# Patient Record
Sex: Female | Born: 1995
Health system: Southern US, Community
[De-identification: ages and names within clinical notes are randomized; demographics above are authoritative.]

## PROBLEM LIST (undated history)

## (undated) ENCOUNTER — Inpatient Hospital Stay: Payer: Self-pay

## (undated) DIAGNOSIS — T783XXA Angioneurotic edema, initial encounter: Secondary | ICD-10-CM

## (undated) DIAGNOSIS — F329 Major depressive disorder, single episode, unspecified: Secondary | ICD-10-CM

## (undated) DIAGNOSIS — F419 Anxiety disorder, unspecified: Secondary | ICD-10-CM

## (undated) DIAGNOSIS — J45909 Unspecified asthma, uncomplicated: Secondary | ICD-10-CM

## (undated) DIAGNOSIS — B2790 Infectious mononucleosis, unspecified without complication: Secondary | ICD-10-CM

## (undated) HISTORY — PX: WISDOM TOOTH EXTRACTION: SHX21

## (undated) HISTORY — DX: Unspecified asthma, uncomplicated: J45.909

## (undated) HISTORY — DX: Angioneurotic edema, initial encounter: T78.3XXA

---
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
## 2017-01-27 ENCOUNTER — Emergency Department
Admission: EM | Admit: 2017-01-27 | Discharge: 2017-01-27 | Disposition: A | Discharge status: Home / Self Care | Payer: 59 | Attending: Emergency Medicine | Admitting: Emergency Medicine

## 2017-01-27 ENCOUNTER — Encounter: Payer: Self-pay | Admitting: Emergency Medicine

## 2017-01-27 DIAGNOSIS — R000 Tachycardia, unspecified: Secondary | ICD-10-CM | POA: Diagnosis present

## 2017-01-27 LAB — BASIC METABOLIC PANEL
Anion gap: 8 (ref 5–15)
BUN: 11 mg/dL (ref 6–20)
CALCIUM: 9.4 mg/dL (ref 8.9–10.3)
CO2: 24 mmol/L (ref 22–32)
CREATININE: 0.65 mg/dL (ref 0.44–1.00)
Chloride: 107 mmol/L (ref 101–111)
GFR calc Af Amer: >60 mL/min (ref >60)
GLUCOSE: 95 mg/dL (ref 65–99)
Potassium: 3.4 mmol/L — ABNORMAL LOW (ref 3.5–5.1)
Sodium: 139 mmol/L (ref 135–145)

## 2017-01-27 LAB — URINALYSIS, COMPLETE (UACMP) WITH MICROSCOPIC
Bilirubin Urine: NEGATIVE
GLUCOSE, UA: NEGATIVE mg/dL
Hgb urine dipstick: NEGATIVE
Ketones, ur: 20 mg/dL — AB
Leukocytes, UA: NEGATIVE
NITRITE: NEGATIVE
PROTEIN: NEGATIVE mg/dL
SPECIFIC GRAVITY, URINE: 1.013 (ref 1.005–1.030)
pH: 6 (ref 5.0–8.0)

## 2017-01-27 LAB — CBC
HCT: 38.9 % (ref 35.0–47.0)
HEMOGLOBIN: 13.4 g/dL (ref 12.0–16.0)
MCH: 31.2 pg (ref 26.0–34.0)
MCHC: 34.4 g/dL (ref 32.0–36.0)
MCV: 90.9 fL (ref 80.0–100.0)
PLATELETS: 267 10*3/uL (ref 150–440)
RBC: 4.28 MIL/uL (ref 3.80–5.20)
RDW: 13.8 % (ref 11.5–14.5)
WBC: 11.9 10*3/uL — ABNORMAL HIGH (ref 3.6–11.0)

## 2017-01-27 LAB — TSH: TSH: 2.169 u[IU]/mL (ref 0.350–4.500)

## 2017-01-27 LAB — TROPONIN I: Troponin I: <0.03 ng/mL (ref <0.03)

## 2017-01-27 LAB — POCT PREGNANCY, URINE: Preg Test, Ur: NEGATIVE

## 2017-01-27 NOTE — ED Triage Notes (Signed)
Patient with complaint of feeling dizzy that started yesterday. Patient states that yesterday when she started feeling dizzy that her heart rate was 150. Patient states that since then she continues to feel dizzy and that her heart rate has been running between 120 and 130.

## 2017-01-27 NOTE — ED Provider Notes (Signed)
[facility] Emergency Department Provider Note   ____________________________________________   I have reviewed the triage vital signs and the nursing notes.   HISTORY  Chief Complaint Tachycardia and Dizziness   History limited by: Not Limited   HPI Ariel Ortega is a 21 y.o. female who presents to the emergency department today because of concerns for fast heart rate. The patient states she first noticed it yesterday. She states that she was at work when it started. She started feeling like her heart was racing and she felt dizzy. She checked her heart rate at that time it was elevated to 130 to 150. She states that it was again elevated today. At the time my examination however she states she feels better. She does not have dizziness. She denies any pain. She denies similar symptoms in the past. She denies any heat intolerance. Denies any unintended weight loss.   Past Medical History:  Diagnosis Date  . Anxiety   . Depression 04/19/2014  . Mononucleosis 04/09/11    Patient Active Problem List   Diagnosis Date Noted  . Benign paroxysmal positional vertigo 09/23/2014  . History of drug overdose 05/08/2014  . Panic disorder 04/22/2014  . MDD (major depressive disorder), recurrent episode, severe (HCC) 04/19/2014  . GAD (generalized anxiety disorder) 12/25/2013  . Panic attacks 12/25/2013  . Food allergy 10/02/2012  . Patellar tendonitis 06/14/2012    History reviewed. No pertinent surgical history.  Prior to Admission medications   Medication Sig Start Date End Date Taking? Authorizing Provider  EPINEPHrine (EPIPEN) 0.3 mg/0.3 mL DEVI Inject 0.3 mLs (0.3 mg total) into the muscle once. 10/02/12   Joaquim Nam, MD    Allergies Effexor [venlafaxine] and Milk-related compounds  Family History  Problem Relation Age of Onset  . Cancer - Prostate Father   . Hyperlipidemia Father   . Hypertension Father   . Depression Mother   . Anxiety  disorder Mother     Social History Social History  Substance Use Topics  . Smoking status: Never Smoker  . Smokeless tobacco: Never Used  . Alcohol use No    Review of Systems Constitutional: No fever/chills Eyes: No visual changes. ENT: No sore throat. Cardiovascular: Positive for fast heart rate.  Respiratory: Denies shortness of breath. Gastrointestinal: No abdominal pain.  No nausea, no vomiting.  No diarrhea.   Genitourinary: Negative for dysuria. Musculoskeletal: Negative for back pain. Skin: Negative for rash. Neurological: Negative for headaches, focal weakness or numbness.  ____________________________________________   PHYSICAL EXAM:  VITAL SIGNS: ED Triage Vitals  Enc Vitals Group     BP 01/27/17 2009 (!) 117/55     Pulse Rate 01/27/17 2009 89     Resp 01/27/17 2009 18     Temp 01/27/17 2009 99 F (37.2 C)     Temp Source 01/27/17 2009 Oral     SpO2 01/27/17 2009 100 %     Weight 01/27/17 2006 139 lb (63 kg)     Height 01/27/17 2006  (1.575 m)   Constitutional: Alert and oriented. Well appearing and in no distress. Eyes: Conjunctivae are normal.  ENT   Head: Normocephalic and atraumatic.   Nose: No congestion/rhinnorhea.   Mouth/Throat: Mucous membranes are moist.   Neck: No stridor. Hematological/Lymphatic/Immunilogical: No cervical lymphadenopathy. Cardiovascular: Normal rate, regular rhythm.  No murmurs, rubs, or gallops.  Respiratory: Normal respiratory effort without tachypnea nor retractions. Breath sounds are clear and equal bilaterally. No wheezes/rales/rhonchi. Gastrointestinal: Soft and non tender. No rebound.  No guarding.  Genitourinary: Deferred Musculoskeletal: Normal range of motion in all extremities. No lower extremity edema. Neurologic:  Normal speech and language. No gross focal neurologic deficits are appreciated.  Skin:  Skin is warm, dry and intact. No rash noted. Psychiatric: Mood and affect are normal. Speech  and behavior are normal. Patient exhibits appropriate insight and judgment.  ____________________________________________    LABS (pertinent positives/negatives)  Labs Reviewed  BASIC METABOLIC PANEL - Abnormal; Notable for the following:       Result Value   Potassium 3.4 (*)    All other components within normal limits  CBC - Abnormal; Notable for the following:    WBC 11.9 (*)    All other components within normal limits  URINALYSIS, COMPLETE (UACMP) WITH MICROSCOPIC - Abnormal; Notable for the following:    Color, Urine YELLOW (*)    APPearance CLEAR (*)    Ketones, ur 20 (*)    Bacteria, UA RARE (*)    Squamous Epithelial / LPF 6-30 (*)    All other components within normal limits  TROPONIN I  TSH  POC URINE PREG, ED  POCT PREGNANCY, URINE     ____________________________________________   EKG  I, Phineas SemenGraydon Lynnea Vandervoort, attending physician, personally viewed and interpreted this EKG  EKG Time: 2009 Rate: 83 Rhythm: sinus rhythm Axis: normal Intervals: qtc 437 QRS: narrow ST changes: no st elevation Impression: normal ekg   ____________________________________________    RADIOLOGY  None  ____________________________________________   PROCEDURES  Procedures  ____________________________________________   INITIAL IMPRESSION / ASSESSMENT AND PLAN / ED COURSE  Pertinent labs & imaging results that were available during my care of the patient were reviewed by me and considered in my medical decision making (see chart for details).  patient presented to the emergency department today because of concerns for tachycardia. Patient was not tachycardic here in the emergency department and did state that she felt better than she had earlier. Workup without any concerning findings. There were some ketones in her urine of unclear significance. Patient states she has been hydrating. Did discuss with patient importance of primary care follow-up. Discussed return  precautions.  ____________________________________________   FINAL CLINICAL IMPRESSION(S) / ED DIAGNOSES  Final diagnoses:  Tachycardia     Note: This dictation was prepared with Dragon dictation. Any transcriptional errors that result from this process are unintentional     Phineas SemenGoodman, Solene Hereford, MD 01/27/17 2205

## 2017-01-27 NOTE — Discharge Instructions (Signed)
Please seek medical attention for any high fevers, chest pain, shortness of breath, change in behavior, persistent vomiting, bloody stool or any other new or concerning symptoms.  

## 2017-01-27 NOTE — ED Notes (Signed)
Pt verbalizes understanding of discharge instructions.

## 2017-05-17 NOTE — L&D Delivery Note (Addendum)
Delivery Note  1431 In room to see patient, reports increased pelvic pressure. SVE: 10/100/+2, vtx. Effective maternal pushing efforts noted.   Spontaneous vaginal birth of liveborn female patient, at 39 weeks 3 days, in right occiput anterior position followed by posterior hand at 1459. Infant immediately to maternal abdomen. Delayed cord clamping, skin to skin, three (3) vessel cord. APGARs: 8, 9. Weight pending. Receiving nurse present at bedside for birth.   Pitocin infusing. Intact placenta delivered at 1506. Second degree perineal and right labial lacerations repaired with 3-0 vicryl rapide under epidural anesthesia. Lacerations well approximated and hemostatic. Uterus firm. Lochia small. QBL: 325 ml. Vault check completed. Counts correct x 2.   Initiate routine postpartum care and orders. Mom to postpartum.  Baby to Couplet care / Skin to Skin.  FOB and patient's mother present at bedside and overjoyed with the birth of EMMALYNE.    Gunnar BullaJenkins Yeny Hans Rusher, CNM [facility], [facility] 02/04/2018, 3:36 PM

## 2017-06-09 ENCOUNTER — Encounter: Payer: Self-pay | Admitting: Obstetrics and Gynecology

## 2017-06-09 ENCOUNTER — Ambulatory Visit: Payer: Self-pay | Admitting: Obstetrics and Gynecology

## 2017-06-09 VITALS — BP 115/69 | HR 92 | Ht 62.0 in | Wt 145.3 lb

## 2017-06-09 DIAGNOSIS — N912 Amenorrhea, unspecified: Secondary | ICD-10-CM

## 2017-06-09 DIAGNOSIS — Z3401 Encounter for supervision of normal first pregnancy, first trimester: Secondary | ICD-10-CM

## 2017-06-09 LAB — POCT URINE PREGNANCY: Preg Test, Ur: POSITIVE — AB

## 2017-06-09 NOTE — Progress Notes (Signed)
HPI:      [name] is a 22 y.o. G1P0000 who LMP was Patient's last menstrual period was 01/13/2017 (lmp unknown).  Subjective:   She presents today complaining of amenorrhea and a positive home pregnancy test.  She is unsure of her last menstrual period but believes that occurred in early December.  She has had some pelvic cramping but denies any bleeding.  She has some nausea but is not vomiting.    Hx: The following portions of the patient's history were reviewed and updated as appropriate:             She  has a past medical history of Anxiety, Depression (04/19/2014), and Mononucleosis (04/09/11). She does not have any pertinent problems on file. She  has no past surgical history on file. Her family history includes Anxiety disorder in her mother; Cancer in her father; Cancer - Prostate in her father; Depression in her mother; Hyperlipidemia in her father; Hypertension in her father. She  reports that  has never smoked. she has never used smokeless tobacco. She reports that she does not drink alcohol or use drugs. She is allergic to effexor [venlafaxine] and milk-related compounds.       Review of Systems:  Review of Systems  Constitutional: Denied constitutional symptoms, night sweats, recent illness, fatigue, fever, insomnia and weight loss.  Eyes: Denied eye symptoms, eye pain, photophobia, vision change and visual disturbance.  Ears/Nose/Throat/Neck: Denied ear, nose, throat or neck symptoms, hearing loss, nasal discharge, sinus congestion and sore throat.  Cardiovascular: Denied cardiovascular symptoms, arrhythmia, chest pain/pressure, edema, exercise intolerance, orthopnea and palpitations.  Respiratory: Denied pulmonary symptoms, asthma, pleuritic pain, productive sputum, cough, dyspnea and wheezing.  Gastrointestinal: Denied, gastro-esophageal reflux, melena, nausea and vomiting.  Genitourinary: Denied genitourinary symptoms including symptomatic vaginal discharge,  pelvic relaxation issues, and urinary complaints.  Musculoskeletal: Denied musculoskeletal symptoms, stiffness, swelling, muscle weakness and myalgia.  Dermatologic: Denied dermatology symptoms, rash and scar.  Neurologic: Denied neurology symptoms, dizziness, headache, neck pain and syncope.  Psychiatric: Denied psychiatric symptoms, anxiety and depression.  Endocrine: Denied endocrine symptoms including hot flashes and night sweats.   Meds:   Current Outpatient Medications on File Prior to Visit  Medication Sig Dispense Refill  . EPINEPHrine (EPIPEN) 0.3 mg/0.3 mL DEVI Inject 0.3 mLs (0.3 mg total) into the muscle once. 2 Device 1  . Prenatal Vit-Fe Fumarate-FA (PRENATAL MULTIVITAMIN) TABS tablet Take 1 tablet by mouth daily at 12 noon.     No current facility-administered medications on file prior to visit.     Objective:     Vitals:   06/09/17 1608  BP: 115/69  Pulse: 92    Urinary beta-hCG positive            Assessment:    G1P0000 Patient Active Problem List   Diagnosis Date Noted  . Benign paroxysmal positional vertigo 09/23/2014  . History of drug overdose 05/08/2014  . Panic disorder 04/22/2014  . MDD (major depressive disorder), recurrent episode, severe (HCC) 04/19/2014  . GAD (generalized anxiety disorder) 12/25/2013  . Panic attacks 12/25/2013  . Food allergy 10/02/2012  . Patellar tendonitis 06/14/2012     1. Amenorrhea   2. Supervision of normal first teen pregnancy in first trimester     Uncomplicated teen first trimester pregnancy-unknown dates.   Plan:            Prenatal Plan 1.  The patient was given prenatal literature. 2.  She was continued on prenatal vitamins. 3.  A  prenatal lab panel was ordered or drawn. 4.  An ultrasound was ordered to better determine an EDC. 5.  A nurse visit was scheduled. 6.  Genetic testing and testing for other inheritable conditions discussed in detail. She will decide in the future whether to have these labs  performed. 7.  A general overview of pregnancy testing, visit schedule, ultrasound schedule, and prenatal care was discussed.   Orders Orders Placed This Encounter  Procedures  . POCT urine pregnancy    No orders of the defined types were placed in this encounter.     F/U  Return in about 5 weeks (around 07/14/2017).  Elonda Huskyavid J. Nikoloz Huy, M.D. 06/09/2017 5:09 PM

## 2017-06-10 ENCOUNTER — Other Ambulatory Visit: Payer: Self-pay | Admitting: Obstetrics and Gynecology

## 2017-06-10 DIAGNOSIS — N926 Irregular menstruation, unspecified: Secondary | ICD-10-CM

## 2017-06-10 DIAGNOSIS — Z3201 Encounter for pregnancy test, result positive: Secondary | ICD-10-CM

## 2017-06-16 ENCOUNTER — Ambulatory Visit (INDEPENDENT_AMBULATORY_CARE_PROVIDER_SITE_OTHER): Payer: Managed Care, Other (non HMO)

## 2017-06-16 DIAGNOSIS — Z3201 Encounter for pregnancy test, result positive: Secondary | ICD-10-CM | POA: Diagnosis not present

## 2017-06-16 DIAGNOSIS — N926 Irregular menstruation, unspecified: Secondary | ICD-10-CM | POA: Diagnosis not present

## 2017-06-27 ENCOUNTER — Encounter: Payer: Self-pay | Admitting: Certified Nurse Midwife

## 2017-06-27 ENCOUNTER — Ambulatory Visit (INDEPENDENT_AMBULATORY_CARE_PROVIDER_SITE_OTHER): Payer: Managed Care, Other (non HMO) | Admitting: Certified Nurse Midwife

## 2017-06-27 VITALS — BP 111/48 | HR 76 | Ht 62.0 in | Wt 142.2 lb

## 2017-06-27 DIAGNOSIS — Z3401 Encounter for supervision of normal first pregnancy, first trimester: Secondary | ICD-10-CM

## 2017-06-27 NOTE — Progress Notes (Signed)
Ariel AsalMichelle Ortega presents for NOB nurse interview visit. Pregnancy confirmation done _[facility]_____.  G-1 .  P-  0. Pregnancy education material explained and given. _2__ cats in the home. NOB labs ordered.  HIV labs and Drug screen were explained optional and she did not decline. Drug screen ordered. PNV encouraged. Genetic screening options discussed. Genetic testing: Ordered..  Pt may discuss with provider. Pt. To follow up with provider in _4_ weeks for NOB physical.  All questions answered.

## 2017-06-28 ENCOUNTER — Telehealth: Payer: Self-pay | Admitting: Certified Nurse Midwife

## 2017-06-28 ENCOUNTER — Telehealth: Payer: Self-pay

## 2017-06-28 LAB — CBC WITH DIFFERENTIAL
Basophils Absolute: 0.1 10*3/uL (ref 0.0–0.2)
Basos: 1 %
EOS (ABSOLUTE): 0.1 10*3/uL (ref 0.0–0.4)
EOS: 1 %
Hematocrit: 37.3 % (ref 34.0–46.6)
Hemoglobin: 12.6 g/dL (ref 11.1–15.9)
IMMATURE GRANULOCYTES: 0 %
Immature Grans (Abs): 0 10*3/uL (ref 0.0–0.1)
Lymphocytes Absolute: 2.5 10*3/uL (ref 0.7–3.1)
Lymphs: 30 %
MCH: 30.8 pg (ref 26.6–33.0)
MCHC: 33.8 g/dL (ref 31.5–35.7)
MCV: 91 fL (ref 79–97)
Monocytes Absolute: 0.7 10*3/uL (ref 0.1–0.9)
Monocytes: 8 %
NEUTROS PCT: 60 %
Neutrophils Absolute: 5.1 10*3/uL (ref 1.4–7.0)
RBC: 4.09 x10E6/uL (ref 3.77–5.28)
RDW: 13.6 % (ref 12.3–15.4)
WBC: 8.4 10*3/uL (ref 3.4–10.8)

## 2017-06-28 LAB — MONITOR DRUG PROFILE 14(MW)
AMPHETAMINE SCREEN URINE: NEGATIVE ng/mL
BARBITURATE SCREEN URINE: NEGATIVE ng/mL
BENZODIAZEPINE SCREEN, URINE: NEGATIVE ng/mL
Buprenorphine, Urine: NEGATIVE ng/mL
CANNABINOIDS UR QL SCN: NEGATIVE ng/mL
Cocaine (Metab) Scrn, Ur: NEGATIVE ng/mL
Creatinine(Crt), U: 101.1 mg/dL (ref 20.0–300.0)
Fentanyl, Urine: NEGATIVE pg/mL
Meperidine Screen, Urine: NEGATIVE ng/mL
Methadone Screen, Urine: NEGATIVE ng/mL
OXYCODONE+OXYMORPHONE UR QL SCN: NEGATIVE ng/mL
Opiate Scrn, Ur: NEGATIVE ng/mL
Ph of Urine: 6.5 (ref 4.5–8.9)
Phencyclidine Qn, Ur: NEGATIVE ng/mL
Propoxyphene Scrn, Ur: NEGATIVE ng/mL
SPECIFIC GRAVITY: 1.021
Tramadol Screen, Urine: NEGATIVE ng/mL

## 2017-06-28 LAB — URINALYSIS, ROUTINE W REFLEX MICROSCOPIC
Bilirubin, UA: NEGATIVE
Glucose, UA: NEGATIVE
KETONES UA: NEGATIVE
LEUKOCYTES UA: NEGATIVE
Nitrite, UA: NEGATIVE
Protein, UA: NEGATIVE
RBC, UA: NEGATIVE
SPEC GRAV UA: 1.02 (ref 1.005–1.030)
Urobilinogen, Ur: 0.2 mg/dL (ref 0.2–1.0)
pH, UA: 6.5 (ref 5.0–7.5)

## 2017-06-28 LAB — AB SCR+ANTIBODY ID

## 2017-06-28 LAB — RPR: RPR Ser Ql: NONREACTIVE

## 2017-06-28 LAB — ABO AND RH: Rh Factor: POSITIVE

## 2017-06-28 LAB — GC/CHLAMYDIA PROBE AMP
Chlamydia trachomatis, NAA: NEGATIVE
NEISSERIA GONORRHOEAE BY PCR: NEGATIVE

## 2017-06-28 LAB — ANTIBODY SCREEN

## 2017-06-28 LAB — NICOTINE SCREEN, URINE: COTININE UR QL SCN: NEGATIVE ng/mL

## 2017-06-28 LAB — VARICELLA ZOSTER ANTIBODY, IGG: Varicella zoster IgG: 421 index (ref >165)

## 2017-06-28 LAB — HEPATITIS B SURFACE ANTIGEN: Hepatitis B Surface Ag: NEGATIVE

## 2017-06-28 LAB — RUBELLA SCREEN: Rubella Antibodies, IGG: 1.13 index (ref >0.99)

## 2017-06-28 LAB — HIV ANTIBODY (ROUTINE TESTING W REFLEX): HIV Screen 4th Generation wRfx: NONREACTIVE

## 2017-06-28 NOTE — Telephone Encounter (Signed)
Pt aware she may have panorama at [place] on 07/19/2017. Pt will be almost 10 weeks at that time.

## 2017-06-28 NOTE — Telephone Encounter (Signed)
The patient called and stated that she contacted her insurance and they informed her that the genetic testing (Panarma) will be covered under her insurance. No other information was disclosed. Please advise.

## 2017-06-28 NOTE — Telephone Encounter (Signed)
Voicemail message left for pt to return my call- re: Panorama.

## 2017-07-01 LAB — URINE CULTURE

## 2017-07-14 ENCOUNTER — Encounter: Payer: Self-pay | Admitting: Obstetrics and Gynecology

## 2017-07-19 ENCOUNTER — Ambulatory Visit (INDEPENDENT_AMBULATORY_CARE_PROVIDER_SITE_OTHER): Payer: Managed Care, Other (non HMO) | Admitting: Certified Nurse Midwife

## 2017-07-19 ENCOUNTER — Encounter: Payer: Self-pay | Admitting: Certified Nurse Midwife

## 2017-07-19 VITALS — BP 109/51 | HR 72 | Wt 143.5 lb

## 2017-07-19 DIAGNOSIS — Z3401 Encounter for supervision of normal first pregnancy, first trimester: Secondary | ICD-10-CM

## 2017-07-19 LAB — POCT URINALYSIS DIPSTICK
BILIRUBIN UA: NEGATIVE
Blood, UA: NEGATIVE
GLUCOSE UA: NEGATIVE
KETONES UA: NEGATIVE
Leukocytes, UA: NEGATIVE
Nitrite, UA: NEGATIVE
Protein, UA: NEGATIVE
SPEC GRAV UA: 1.01 (ref 1.010–1.025)
Urobilinogen, UA: 0.2 E.U./dL
pH, UA: 7.5 (ref 5.0–8.0)

## 2017-07-19 NOTE — Patient Instructions (Signed)

## 2017-07-19 NOTE — Progress Notes (Signed)
Pt is here for a new OB physical. 

## 2017-07-19 NOTE — Progress Notes (Signed)
NEW OB HISTORY AND PHYSICAL  SUBJECTIVE:       Ariel Ortega is a 22 y.o. G1P0000 female, No LMP recorded. Patient is pregnant., Estimated Date of Delivery: 02/08/18, [redacted]w[redacted]d, presents today for establishment of Prenatal Care.She has no unusual complaints.    Gynecologic History No LMP recorded. Patient is pregnant. Normal Contraception: none Last Pap: N/a.   Obstetric History OB History  Gravida Para Term Preterm AB Living  1 0 0 0 0    SAB TAB Ectopic Multiple Live Births  0 0 0        # Outcome Date GA Lbr Len/2nd Weight Sex Delivery Anes PTL Lv  1 Current               Past Medical History:  Diagnosis Date  . Anxiety   . Depression 04/19/2014  . Mononucleosis 04/09/11    No past surgical history on file.  Current Outpatient Medications on File Prior to Visit  Medication Sig Dispense Refill  . EPINEPHrine (EPIPEN) 0.3 mg/0.3 mL DEVI Inject 0.3 mLs (0.3 mg total) into the muscle once. 2 Device 1  . Prenatal Vit-Fe Fumarate-FA (PRENATAL MULTIVITAMIN) TABS tablet Take 1 tablet by mouth daily at 12 noon.     No current facility-administered medications on file prior to visit.     Allergies  Allergen Reactions  . Effexor [Venlafaxine] Nausea And Vomiting  . Milk-Related Compounds Nausea And Vomiting    Lactose intolerance    Social History   Socioeconomic History  . Marital status: Unknown    Spouse name: Not on file  . Number of children: Not on file  . Years of education: Not on file  . Highest education level: Not on file  Social Needs  . Financial resource strain: Not on file  . Food insecurity - worry: Not on file  . Food insecurity - inability: Not on file  . Transportation needs - medical: Not on file  . Transportation needs - non-medical: Not on file  Occupational History  . Not on file  Tobacco Use  . Smoking status: Never Smoker  . Smokeless tobacco: Never Used  Substance and Sexual Activity  . Alcohol use: No    Alcohol/week: 0.0 oz  . Drug  use: No  . Sexual activity: Yes    Birth control/protection: None  Other Topics Concern  . Not on file  Social History Narrative   ** Merged History Encounter **        Family History  Adopted: Yes  Problem Relation Age of Onset  . Cancer - Prostate Father   . Hyperlipidemia Father   . Hypertension Father   . Cancer Father        prostate cancer   . Depression Mother   . Anxiety disorder Mother     The following portions of the patient's history were reviewed and updated as appropriate: allergies, current medications, past OB history, past medical history, past surgical history, past family history, past social history, and problem list.    OBJECTIVE: Initial Physical Exam (New OB)  GENERAL APPEARANCE: alert, well appearing, in no apparent distress, oriented to person, place and time HEAD: normocephalic, atraumatic MOUTH: mucous membranes moist, pharynx normal without lesions THYROID: no thyromegaly or masses present BREASTS: no masses noted, no significant tenderness, no palpable axillary nodes, no skin changes LUNGS: clear to auscultation, no wheezes, rales or rhonchi, symmetric air entry HEART: regular rate and rhythm, no murmurs ABDOMEN: soft, nontender, nondistended, no abnormal masses, no epigastric pain  and fundus soft, nontender 11 weeks size EXTREMITIES: no redness or tenderness in the calves or thighs, no edema, no limitation in range of motion, intact peripheral pulses SKIN: normal coloration and turgor, no rashes LYMPH NODES: no adenopathy palpable NEUROLOGIC: alert, oriented, normal speech, no focal findings or movement disorder noted  PELVIC EXAM EXTERNAL GENITALIA: normal appearing vulva with no masses, tenderness or lesions VAGINA: no abnormal discharge or lesions CERVIX: no lesions or cervical motion tenderness, white discharge no odor, itching or burning UTERUS: gravid ADNEXA: no masses palpable and nontender OB EXAM PELVIMETRY: appears  adequate RECTUM: exam not indicated  ASSESSMENT: Normal pregnancy  PLAN: New OB counseling: The patient has been given an overview regarding routine prenatal care. Recommendations regarding diet, weight gain, and exercise in pregnancy were given. Prenatal testing, optional genetic testing, and ultrasound use in pregnancy were reviewed. Pt encouraged to return @ 12 wks for panorama redraw. Benefits of Breast Feeding were discussed. Note today given for work for no lifting more than 25 lbs and no restraining animal more than 25 lbs. The patient is encouraged to consider nursing her baby post partum.  Doreene BurkeAnnie Josha Weekley, CNM

## 2017-07-25 ENCOUNTER — Other Ambulatory Visit: Payer: Managed Care, Other (non HMO)

## 2017-08-03 ENCOUNTER — Telehealth: Payer: Self-pay | Admitting: Certified Nurse Midwife

## 2017-08-03 NOTE — Telephone Encounter (Signed)
My chart message with genetic screening results.   Ariel BurkeAnnie Yazeed Ortega, CNM

## 2017-08-15 ENCOUNTER — Ambulatory Visit (INDEPENDENT_AMBULATORY_CARE_PROVIDER_SITE_OTHER): Payer: Managed Care, Other (non HMO) | Admitting: Certified Nurse Midwife

## 2017-08-15 VITALS — BP 103/57 | HR 83 | Wt 145.4 lb

## 2017-08-15 DIAGNOSIS — Z3689 Encounter for other specified antenatal screening: Secondary | ICD-10-CM

## 2017-08-15 DIAGNOSIS — Z3401 Encounter for supervision of normal first pregnancy, first trimester: Secondary | ICD-10-CM

## 2017-08-15 LAB — POCT URINALYSIS DIPSTICK
Bilirubin, UA: NEGATIVE
GLUCOSE UA: NEGATIVE
Ketones, UA: NEGATIVE
LEUKOCYTES UA: NEGATIVE
Nitrite, UA: NEGATIVE
Protein, UA: NEGATIVE
RBC UA: NEGATIVE
SPEC GRAV UA: 1.01 (ref 1.010–1.025)
Urobilinogen, UA: 0.2 E.U./dL
pH, UA: 5 (ref 5.0–8.0)

## 2017-08-15 NOTE — Patient Instructions (Signed)
Back Pain in Pregnancy Back pain during pregnancy is common. Back pain may be caused by several factors that are related to changes during your pregnancy. Follow these instructions at home: Managing pain, stiffness, and swelling  If directed, apply ice for sudden (acute) back pain. ? Put ice in a plastic bag. ? Place a towel between your skin and the bag. ? Leave the ice on for 20 minutes, 2-3 times per day.  If directed, apply heat to the affected area before you exercise: ? Place a towel between your skin and the heat pack or heating pad. ? Leave the heat on for 20-30 minutes. ? Remove the heat if your skin turns bright red. This is especially important if you are unable to feel pain, heat, or cold. You may have a greater risk of getting burned. Activity  Exercise as told by your health care provider. Exercising is the best way to prevent or manage back pain.  Listen to your body when lifting. If lifting hurts, ask for help or bend your knees. This uses your leg muscles instead of your back muscles.  Squat down when picking up something from the floor. Do not bend over.  Only use bed rest as told by your health care provider. Bed rest should only be used for the most severe episodes of back pain. Standing, Sitting, and Lying Down  Do not stand in one place for long periods of time.  Use good posture when sitting. Make sure your head rests over your shoulders and is not hanging forward. Use a pillow on your lower back if necessary.  Try sleeping on your side, preferably the left side, with a pillow or two between your legs. If you are sore after a night's rest, your bed may be too soft. A firm mattress may provide more support for your back during pregnancy. General instructions  Do not wear high heels.  Eat a healthy diet. Try to gain weight within your health care provider's recommendations.  Use a maternity girdle, elastic sling, or back brace as told by your health care  provider.  Take over-the-counter and prescription medicines only as told by your health care provider.  Keep all follow-up visits as told by your health care provider. This is important. This includes any visits with any specialists, such as a physical therapist. Contact a health care provider if:  Your back pain interferes with your daily activities.  You have increasing pain in other parts of your body. Get help right away if:  You develop numbness, tingling, weakness, or problems with the use of your arms or legs.  You develop severe back pain that is not controlled with medicine.  You have a sudden change in bowel or bladder control.  You develop shortness of breath, dizziness, or you faint.  You develop nausea, vomiting, or sweating.  You have back pain that is a rhythmic, cramping pain similar to labor pains. Labor pain is usually 1-2 minutes apart, lasts for about 1 minute, and involves a bearing down feeling or pressure in your pelvis.  You have back pain and your water breaks or you have vaginal bleeding.  You have back pain or numbness that travels down your leg.  Your back pain developed after you fell.  You develop pain on one side of your back.  You see blood in your urine.  You develop skin blisters in the area of your back pain. This information is not intended to replace advice given to you   by your health care provider. Make sure you discuss any questions you have with your health care provider. Document Released: 08/11/2005 Document Revised: 10/09/2015 Document Reviewed: 01/15/2015 Elsevier Interactive Patient Education  2018 [employer]. Round Ligament Pain The round ligament is a cord of muscle and tissue that helps to support the uterus. It can become a source of pain during pregnancy if it becomes stretched or twisted as the baby grows. The pain usually begins in the second trimester of pregnancy, and it can come and go until the baby is delivered. It is  not a serious problem, and it does not cause harm to the baby. Round ligament pain is usually a short, sharp, and pinching pain, but it can also be a dull, lingering, and aching pain. The pain is felt in the lower side of the abdomen or in the groin. It usually starts deep in the groin and moves up to the outside of the hip area. Pain can occur with:  A sudden change in position.  Rolling over in bed.  Coughing or sneezing.  Physical activity.  Follow these instructions at home: Watch your condition for any changes. Take these steps to help with your pain:  When the pain starts, relax. Then try: ? Sitting down. ? Flexing your knees up to your abdomen. ? Lying on your side with one pillow under your abdomen and another pillow between your legs. ? Sitting in a warm bath for 15-20 minutes or until the pain goes away.  Take over-the-counter and prescription medicines only as told by your health care provider.  Move slowly when you sit and stand.  Avoid long walks if they cause pain.  Stop or lessen your physical activities if they cause pain.  Contact a health care provider if:  Your pain does not go away with treatment.  You feel pain in your back that you did not have before.  Your medicine is not helping. Get help right away if:  You develop a fever or chills.  You develop uterine contractions.  You develop vaginal bleeding.  You develop nausea or vomiting.  You develop diarrhea.  You have pain when you urinate. This information is not intended to replace advice given to you by your health care provider. Make sure you discuss any questions you have with your health care provider. Document Released: 02/10/2008 Document Revised: 10/09/2015 Document Reviewed: 07/10/2014 Elsevier Interactive Patient Education  2018 [employer]. Common Medications Safe in Pregnancy  Acne:      Constipation:  Benzoyl Peroxide     Colace  Clindamycin      Dulcolax Suppository  Topica  Erythromycin     Fibercon  Salicylic Acid      Metamucil         Miralax AVOID:        Senakot   Accutane    Cough:  Retin-A       Cough Drops  Tetracycline      Phenergan w/ Codeine if Rx  Minocycline      Robitussin (Plain & DM)  Antibiotics:     Crabs/Lice:  Ceclor       RID  Cephalosporins    AVOID:  E-Mycins      Kwell  Keflex  Macrobid/Macrodantin   Diarrhea:  Penicillin      Kao-Pectate  Zithromax      Imodium AD         PUSH FLUIDS AVOID:       Cipro  Fever:  Tetracycline      Tylenol (Regular or Extra  Minocycline       Strength)  Levaquin      Extra Strength-Do not          Exceed 8 tabs/24 hrs Caffeine:        <215m/day (equiv. To 1 cup of coffee or  approx. 3 12 oz sodas)         Gas: Cold/Hayfever:       Gas-X  Benadryl      Mylicon  Claritin       Phazyme  **Claritin-D        Chlor-Trimeton    Headaches:  Dimetapp      ASA-Free Excedrin  Drixoral-Non-Drowsy     Cold Compress  Mucinex (Guaifenasin)     Tylenol (Regular or Extra  Sudafed/Sudafed-12 Hour     Strength)  **Sudafed PE Pseudoephedrine   Tylenol Cold & Sinus     Vicks Vapor Rub  Zyrtec  **AVOID if Problems With Blood Pressure         Heartburn: Avoid lying down for at least 1 hour after meals  Aciphex      Maalox     Rash:  Milk of Magnesia     Benadryl    Mylanta       1% Hydrocortisone Cream  Pepcid  Pepcid Complete   Sleep Aids:  Prevacid      Ambien   Prilosec       Benadryl  Rolaids       Chamomile Tea  Tums (Limit 4/day)     Unisom  Zantac       Tylenol PM         Warm milk-add vanilla or  Hemorrhoids:       Sugar for taste  Anusol/Anusol H.C.  (RX: Analapram 2.5%)  Sugar Substitutes:  Hydrocortisone OTC     Ok in moderation  Preparation H      Tucks        Vaseline lotion applied to tissue with wiping    Herpes:     Throat:  Acyclovir      Oragel  Famvir  Valtrex     Vaccines:         Flu Shot Leg Cramps:       *Gardasil  Benadryl      Hepatitis  A         Hepatitis B Nasal Spray:       Pneumovax  Saline Nasal Spray     Polio Booster         Tetanus Nausea:       Tuberculosis test or PPD  Vitamin B6 25 mg TID   AVOID:    Dramamine      *Gardasil  Emetrol       Live Poliovirus  Ginger Root 250 mg QID    MMR (measles, mumps &  High Complex Carbs @ Bedtime    rebella)  Sea Bands-Accupressure    Varicella (Chickenpox)  Unisom 1/2 tab TID     *No known complications           If received before Pain:         Known pregnancy;   Darvocet       Resume series after  Lortab        Delivery  Percocet    Yeast:   Tramadol      Femstat  Tylenol 3      Gyne-lotrimin  Ultram  Monistat  Vicodin           MISC:         All Sunscreens           Hair Coloring/highlights          Insect Repellant's          (Including DEET)         Mystic Tans Second Trimester of Pregnancy The second trimester is from week 13 through week 28, month 4 through 6. This is often the time in pregnancy that you feel your best. Often times, morning sickness has lessened or quit. You may have more energy, and you may get hungry more often. Your unborn baby (fetus) is growing rapidly. At the end of the sixth month, he or she is about 9 inches long and weighs about 1 pounds. You will likely feel the baby move (quickening) between 18 and 20 weeks of pregnancy. Follow these instructions at home:  Avoid all smoking, herbs, and alcohol. Avoid drugs not approved by your doctor.  Do not use any tobacco products, including cigarettes, chewing tobacco, and electronic cigarettes. If you need help quitting, ask your doctor. You may get counseling or other support to help you quit.  Only take medicine as told by your doctor. Some medicines are safe and some are not during pregnancy.  Exercise only as told by your doctor. Stop exercising if you start having cramps.  Eat regular, healthy meals.  Wear a good support bra if your breasts are tender.  Do not use hot tubs,  steam rooms, or saunas.  Wear your seat belt when driving.  Avoid raw meat, uncooked cheese, and liter boxes and soil used by cats.  Take your prenatal vitamins.  Take 1500-2000 milligrams of calcium daily starting at the 20th week of pregnancy until you deliver your baby.  Try taking medicine that helps you poop (stool softener) as needed, and if your doctor approves. Eat more fiber by eating fresh fruit, vegetables, and whole grains. Drink enough fluids to keep your pee (urine) clear or pale yellow.  Take warm water baths (sitz baths) to soothe pain or discomfort caused by hemorrhoids. Use hemorrhoid cream if your doctor approves.  If you have puffy, bulging veins (varicose veins), wear support hose. Raise (elevate) your feet for 15 minutes, 3-4 times a day. Limit salt in your diet.  Avoid heavy lifting, wear low heals, and sit up straight.  Rest with your legs raised if you have leg cramps or low back pain.  Visit your dentist if you have not gone during your pregnancy. Use a soft toothbrush to brush your teeth. Be gentle when you floss.  You can have sex (intercourse) unless your doctor tells you not to.  Go to your doctor visits. Get help if:  You feel dizzy.  You have mild cramps or pressure in your lower belly (abdomen).  You have a nagging pain in your belly area.  You continue to feel sick to your stomach (nauseous), throw up (vomit), or have watery poop (diarrhea).  You have bad smelling fluid coming from your vagina.  You have pain with peeing (urination). Get help right away if:  You have a fever.  You are leaking fluid from your vagina.  You have spotting or bleeding from your vagina.  You have severe belly cramping or pain.  You lose or gain weight rapidly.  You have trouble catching your breath and have chest pain.  You notice sudden or  extreme puffiness (swelling) of your face, hands, ankles, feet, or legs.  You have not felt the baby move in over  an hour.  You have severe headaches that do not go away with medicine.  You have vision changes. This information is not intended to replace advice given to you by your health care provider. Make sure you discuss any questions you have with your health care provider. Document Released: 07/28/2009 Document Revised: 10/09/2015 Document Reviewed: 07/04/2012 Elsevier Interactive Patient Education  2017 [employer].

## 2017-08-15 NOTE — Progress Notes (Signed)
ROB-Doing well, no questions or concerns. Anticipatory guidance regarding round ligament pain and course of prenatal care. Reviewed red flag symptoms and when to call. RTC x 5 weeks for anatomy scan and ROB or sooner if needed.

## 2017-08-15 NOTE — Progress Notes (Signed)
Pt is here for an ROB visit. No c/o. 

## 2017-09-20 ENCOUNTER — Encounter: Payer: Self-pay | Admitting: Obstetrics and Gynecology

## 2017-09-20 ENCOUNTER — Ambulatory Visit (INDEPENDENT_AMBULATORY_CARE_PROVIDER_SITE_OTHER): Payer: Managed Care, Other (non HMO)

## 2017-09-20 ENCOUNTER — Ambulatory Visit (INDEPENDENT_AMBULATORY_CARE_PROVIDER_SITE_OTHER): Payer: Managed Care, Other (non HMO) | Admitting: Obstetrics and Gynecology

## 2017-09-20 VITALS — BP 94/57 | HR 77 | Wt 152.0 lb

## 2017-09-20 DIAGNOSIS — Z3401 Encounter for supervision of normal first pregnancy, first trimester: Secondary | ICD-10-CM

## 2017-09-20 DIAGNOSIS — Z3492 Encounter for supervision of normal pregnancy, unspecified, second trimester: Secondary | ICD-10-CM

## 2017-09-20 DIAGNOSIS — Z3689 Encounter for other specified antenatal screening: Secondary | ICD-10-CM

## 2017-09-20 LAB — POCT URINALYSIS DIPSTICK
BILIRUBIN UA: NEGATIVE
Blood, UA: NEGATIVE
Glucose, UA: NEGATIVE
KETONES UA: NEGATIVE
Leukocytes, UA: NEGATIVE
NITRITE UA: NEGATIVE
Protein, UA: NEGATIVE
SPEC GRAV UA: 1.015 (ref 1.010–1.025)
UROBILINOGEN UA: 0.2 U/dL
pH, UA: 7.5 (ref 5.0–8.0)

## 2017-09-20 NOTE — Progress Notes (Signed)
ROB- anatomy scan done today, pt is having lower back pain & hip pain

## 2017-09-20 NOTE — Progress Notes (Signed)
ROB & anatomy scan-reviewed:  Indications: Anatomy Findings:  Singleton intrauterine pregnancy is visualized with FHR at 147 BPM. Biometrics give an (U/S) Gestational age of 70 2/7 weeks and an (U/S) EDD of 02/12/18; this correlates with the clinically established EDD of 02/08/18.  Fetal presentation is breech.  EFW: 301 grams (0lb 11oz). Placenta: Anterior and grade 1. AFI: WNL subjectively.  Anatomic survey is complete and appears WNL; Gender - Female.   Right Ovary measures 2.1 x 1.8 x 1.1 cm. It is normal in appearance. Left Ovary measures 1.9 x 1.6 x 1.3 cm. It is normal appearance. There is no obvious evidence of a corpus luteal cyst. Survey of the adnexa demonstrates no adnexal masses. There is no free peritoneal fluid in the cul de sac.  Impression: 1. 19 2/7 week Viable Singleton Intrauterine pregnancy by U/S. 2. (U/S) EDD is consistent with Clinically established (LMP) EDD of 02/08/18. 3. Normal Anatomy Scan   Discussed low back pain- referred to chiropractor,  Discussed low blood pressure and fainting at work- none recently- to increase hydration.  Encouraged enrolling in classes

## 2017-10-21 ENCOUNTER — Encounter: Payer: Managed Care, Other (non HMO) | Admitting: Certified Nurse Midwife

## 2017-10-26 ENCOUNTER — Ambulatory Visit (INDEPENDENT_AMBULATORY_CARE_PROVIDER_SITE_OTHER): Payer: Managed Care, Other (non HMO) | Admitting: Certified Nurse Midwife

## 2017-10-26 VITALS — BP 105/57 | HR 68 | Wt 163.4 lb

## 2017-10-26 DIAGNOSIS — Z3492 Encounter for supervision of normal pregnancy, unspecified, second trimester: Secondary | ICD-10-CM

## 2017-10-26 LAB — POCT URINALYSIS DIPSTICK
Bilirubin, UA: NEGATIVE
Blood, UA: NEGATIVE
Glucose, UA: NEGATIVE
KETONES UA: NEGATIVE
LEUKOCYTES UA: NEGATIVE
NITRITE UA: NEGATIVE
PROTEIN UA: NEGATIVE
Spec Grav, UA: 1.02 (ref 1.010–1.025)
Urobilinogen, UA: 0.2 E.U./dL
pH, UA: 6 (ref 5.0–8.0)

## 2017-10-26 NOTE — Progress Notes (Signed)
ROB, doing well. Feels good fetal movement. Anticipatory guidance for 28 wk visit. Follow up 3 wks.  Doreene BurkeAnnie Bladimir Auman, CNM

## 2017-10-26 NOTE — Progress Notes (Signed)
Pt is here for an ROB visit. 

## 2017-10-26 NOTE — Patient Instructions (Signed)

## 2017-11-21 ENCOUNTER — Other Ambulatory Visit: Payer: Managed Care, Other (non HMO)

## 2017-11-21 ENCOUNTER — Encounter: Payer: Managed Care, Other (non HMO) | Admitting: Certified Nurse Midwife

## 2017-12-01 ENCOUNTER — Ambulatory Visit (INDEPENDENT_AMBULATORY_CARE_PROVIDER_SITE_OTHER): Payer: Managed Care, Other (non HMO) | Admitting: Certified Nurse Midwife

## 2017-12-01 ENCOUNTER — Other Ambulatory Visit: Payer: Managed Care, Other (non HMO)

## 2017-12-01 VITALS — BP 95/55 | HR 79 | Wt 173.2 lb

## 2017-12-01 DIAGNOSIS — Z3493 Encounter for supervision of normal pregnancy, unspecified, third trimester: Secondary | ICD-10-CM

## 2017-12-01 LAB — POCT URINALYSIS DIPSTICK
Bilirubin, UA: NEGATIVE
GLUCOSE UA: NEGATIVE
Ketones, UA: NEGATIVE
Leukocytes, UA: NEGATIVE
Nitrite, UA: NEGATIVE
Protein, UA: NEGATIVE
RBC UA: NEGATIVE
Spec Grav, UA: <=1.005 — AB (ref 1.010–1.025)
Urobilinogen, UA: 0.2 E.U./dL
pH, UA: 6.5 (ref 5.0–8.0)

## 2017-12-01 MED ORDER — TETANUS-DIPHTH-ACELL PERTUSSIS 5-2.5-18.5 LF-MCG/0.5 IM SUSP
0.5000 mL | Freq: Once | INTRAMUSCULAR | Status: AC
  Administered 2017-12-01: 0.5 mL via INTRAMUSCULAR

## 2017-12-01 NOTE — Progress Notes (Signed)
Pt is here for an ROB visit. Labs,papers,Tdap done.

## 2017-12-01 NOTE — Patient Instructions (Signed)
Preterm Labor and Birth Information Pregnancy normally lasts 39-41 weeks. Preterm labor is when labor starts early. It starts before you have been pregnant for 37 whole weeks. What are the risk factors for preterm labor? Preterm labor is more likely to occur in women who:  Have an infection while pregnant.  Have a cervix that is short.  Have gone into preterm labor before.  Have had surgery on their cervix.  Are younger than age 20.  Are older than age 26.  Are African American.  Are pregnant with two or more babies.  Take street drugs while pregnant.  Smoke while pregnant.  Do not gain enough weight while pregnant.  Got pregnant right after another pregnancy.  What are the symptoms of preterm labor? Symptoms of preterm labor include:  Cramps. The cramps may feel like the cramps some women get during their period. The cramps may happen with watery poop (diarrhea).  Pain in the belly (abdomen).  Pain in the lower back.  Regular contractions or tightening. It may feel like your belly is getting tighter.  Pressure in the lower belly that seems to get stronger.  More fluid (discharge) leaking from the vagina. The fluid may be watery or bloody.  Water breaking.  Why is it important to notice signs of preterm labor? Babies who are born early may not be fully developed. They have a higher chance for:  Long-term heart problems.  Long-term lung problems.  Trouble controlling body systems, like breathing.  Bleeding in the brain.  A condition called cerebral palsy.  Learning difficulties.  Death.  These risks are highest for babies who are born before 40 weeks of pregnancy. How is preterm labor treated? Treatment depends on:  How long you were pregnant.  Your condition.  The health of your baby.  Treatment may involve:  Having a stitch (suture) placed in your cervix. When you give birth, your cervix opens so the baby can come out. The stitch keeps the  cervix from opening too soon.  Staying at the hospital.  Taking or getting medicines, such as: ? Hormone medicines. ? Medicines to stop contractions. ? Medicines to help the baby's lungs develop. ? Medicines to prevent your baby from having cerebral palsy.  What should I do if I am in preterm labor? If you think you are going into labor too soon, call your doctor right away. How can I prevent preterm labor?  Do not use any tobacco products. ? Examples of these are cigarettes, chewing tobacco, and e-cigarettes. ? If you need help quitting, ask your doctor.  Do not use street drugs.  Do not use any medicines unless you ask your doctor if they are safe for you.  Talk with your doctor before taking any herbal supplements.  Make sure you gain enough weight.  Watch for infection. If you think you might have an infection, get it checked right away.  If you have gone into preterm labor before, tell your doctor. This information is not intended to replace advice given to you by your health care provider. Make sure you discuss any questions you have with your health care provider. Document Released: 07/30/2008 Document Revised: 10/14/2015 Document Reviewed: 09/24/2015 Elsevier Interactive Patient Education  2018 [employer]. Back Pain in Pregnancy Back pain during pregnancy is common. Back pain may be caused by several factors that are related to changes during your pregnancy. Follow these instructions at home: Managing pain, stiffness, and swelling  If directed, apply ice for sudden (acute)  back pain. ? Put ice in a plastic bag. ? Place a towel between your skin and the bag. ? Leave the ice on for 20 minutes, 2-3 times per day.  If directed, apply heat to the affected area before you exercise: ? Place a towel between your skin and the heat pack or heating pad. ? Leave the heat on for 20-30 minutes. ? Remove the heat if your skin turns bright red. This is especially important if  you are unable to feel pain, heat, or cold. You may have a greater risk of getting burned. Activity  Exercise as told by your health care provider. Exercising is the best way to prevent or manage back pain.  Listen to your body when lifting. If lifting hurts, ask for help or bend your knees. This uses your leg muscles instead of your back muscles.  Squat down when picking up something from the floor. Do not bend over.  Only use bed rest as told by your health care provider. Bed rest should only be used for the most severe episodes of back pain. Standing, Sitting, and Lying Down  Do not stand in one place for long periods of time.  Use good posture when sitting. Make sure your head rests over your shoulders and is not hanging forward. Use a pillow on your lower back if necessary.  Try sleeping on your side, preferably the left side, with a pillow or two between your legs. If you are sore after a night's rest, your bed may be too soft. A firm mattress may provide more support for your back during pregnancy. General instructions  Do not wear high heels.  Eat a healthy diet. Try to gain weight within your health care provider's recommendations.  Use a maternity girdle, elastic sling, or back brace as told by your health care provider.  Take over-the-counter and prescription medicines only as told by your health care provider.  Keep all follow-up visits as told by your health care provider. This is important. This includes any visits with any specialists, such as a physical therapist. Contact a health care provider if:  Your back pain interferes with your daily activities.  You have increasing pain in other parts of your body. Get help right away if:  You develop numbness, tingling, weakness, or problems with the use of your arms or legs.  You develop severe back pain that is not controlled with medicine.  You have a sudden change in bowel or bladder control.  You develop  shortness of breath, dizziness, or you faint.  You develop nausea, vomiting, or sweating.  You have back pain that is a rhythmic, cramping pain similar to labor pains. Labor pain is usually 1-2 minutes apart, lasts for about 1 minute, and involves a bearing down feeling or pressure in your pelvis.  You have back pain and your water breaks or you have vaginal bleeding.  You have back pain or numbness that travels down your leg.  Your back pain developed after you fell.  You develop pain on one side of your back.  You see blood in your urine.  You develop skin blisters in the area of your back pain. This information is not intended to replace advice given to you by your health care provider. Make sure you discuss any questions you have with your health care provider. Document Released: 08/11/2005 Document Revised: 10/09/2015 Document Reviewed: 01/15/2015 Elsevier Interactive Patient Education  2018 [employer]. Pain Relief During Labor and Delivery Many things  can cause pain during labor and delivery, including:  Pressure on bones and ligaments due to the baby moving through the pelvis.  Stretching of tissues due to the baby moving through the birth canal.  Muscle tension due to anxiety or nervousness.  The uterus tightening (contracting) and relaxing to help move the baby.  There are many ways to deal with the pain of labor and delivery. They include:  Taking prenatal classes. Taking these classes helps you know what to expect during your baby's birth. What you learn will increase your confidence and decrease your anxiety.  Practicing relaxation techniques or doing relaxing activities, such as: ? Focused breathing. ? Meditation. ? Visualization. ? Aroma therapy. ? Listening to your favorite music. ? Hypnosis.  Taking a warm shower or bath (hydrotherapy). This may: ? Provide comfort and relaxation. ? Lessen your perception of pain. ? Decrease the amount of pain medicine  needed. ? Decrease the length of labor.  Getting a massage or counterpressure on your back.  Applying warm packs or ice packs.  Changing positions often, moving around, or using a birthing ball.  Getting: ? Pain medicine through an IV or injection into a muscle. ? Pain medicine inserted into your spinal column. ? Injections of sterile water just under the skin on your lower back (intradermal injections). ? Laughing gas (nitrous oxide).  Discuss your pain control options with your health care provider during your prenatal visits. Explore the options offered by your hospital or birth center. What kinds of medicine are available? There are two kinds of medicines that can be used to relieve pain during labor and delivery:  Analgesics. These medicines decrease pain without causing you to lose feeling or the ability to move your muscles.  Anesthetics. These medicines block feeling in the body and can decrease your ability to move freely.  Both of these kinds of medicine can cause minor side effects, such as nausea, trouble concentrating, and sleepiness. They can also decrease the baby's heart rate before birth and affect the baby's breathing rate after birth. For this reason, health care providers are careful about when and how much medicine is given. What are specific medicines and procedures that provide pain relief? Local Anesthetics Local anesthetics are used to numb a small area of the body. They may be used along with another kind of anesthetic or used to numb the nerves of the vagina, cervix, and perineum during the second stage of labor. General Anesthetics General anesthetics cause you to lose consciousness so you do not feel pain. They are usually only used for an emergency cesarean delivery. General anesthetics are given through an IV tube and a mask. Pudendal Block A pudendal block is a form of local anesthetic. It may be used to relieve the pain associated with pushing or  stretching of the perineum at the time of delivery or to further numb the perineum. A pudendal block is done by injecting numbing medicine through the vaginal wall into a nerve in the pelvis. Epidural Analgesia Epidural analgesia is given through a flexible IV catheter that is inserted into the lower back. Numbing medicine is delivered continuously to the area near your spinal column nerves (epidural space). After having this type of analgesia, you may be able to move your legs but you most likely will not be able to walk. Depending on the amount of medicine given, you may lose all feeling in the lower half of your body, or you may retain some level of sensation, including the  urge to push. Epidural analgesia can be used to provide pain relief for a vaginal birth. Spinal Block A spinal block is similar to epidural analgesia, but the medicine is injected into the spinal fluid instead of the epidural space. A spinal block is only given once. It starts to relieve pain quickly, but the pain relief lasts only 1-6 hours. Spinal blocks can be used for cesarean deliveries. Combined Spinal-Epidural (CSE) Block A CSE block combines the effects of a spinal block and epidural analgesia. The spinal block works quickly to block all pain. The epidural analgesia provides continuous pain relief, even after the effects of the spinal block have worn off. This information is not intended to replace advice given to you by your health care provider. Make sure you discuss any questions you have with your health care provider. Document Released: 08/19/2008 Document Revised: 10/10/2015 Document Reviewed: 09/24/2015 Elsevier Interactive Patient Education  2018 [employer]. Breastfeeding Choosing to breastfeed is one of the best decisions you can make for yourself and your baby. A change in hormones during pregnancy causes your breasts to make breast milk in your milk-producing glands. Hormones prevent breast milk from being  released before your baby is born. They also prompt milk flow after birth. Once breastfeeding has begun, thoughts of your baby, as well as his or her sucking or crying, can stimulate the release of milk from your milk-producing glands. Benefits of breastfeeding Research shows that breastfeeding offers many health benefits for infants and mothers. It also offers a cost-free and convenient way to feed your baby. For your baby  Your first milk (colostrum) helps your baby's digestive system to function better.  Special cells in your milk (antibodies) help your baby to fight off infections.  Breastfed babies are less likely to develop asthma, allergies, obesity, or type 2 diabetes. They are also at lower risk for sudden infant death syndrome (SIDS).  Nutrients in breast milk are better able to meet your baby's needs compared to infant formula.  Breast milk improves your baby's brain development. For you  Breastfeeding helps to create a very special bond between you and your baby.  Breastfeeding is convenient. Breast milk costs nothing and is always available at the correct temperature.  Breastfeeding helps to burn calories. It helps you to lose the weight that you gained during pregnancy.  Breastfeeding makes your uterus return faster to its size before pregnancy. It also slows bleeding (lochia) after you give birth.  Breastfeeding helps to lower your risk of developing type 2 diabetes, osteoporosis, rheumatoid arthritis, cardiovascular disease, and breast, ovarian, uterine, and endometrial cancer later in life. Breastfeeding basics Starting breastfeeding  Find a comfortable place to sit or lie down, with your neck and back well-supported.  Place a pillow or a rolled-up blanket under your baby to bring him or her to the level of your breast (if you are seated). Nursing pillows are specially designed to help support your arms and your baby while you breastfeed.  Make sure that your baby's  tummy (abdomen) is facing your abdomen.  Gently massage your breast. With your fingertips, massage from the outer edges of your breast inward toward the nipple. This encourages milk flow. If your milk flows slowly, you may need to continue this action during the feeding.  Support your breast with 4 fingers underneath and your thumb above your nipple (make the letter "C" with your hand). Make sure your fingers are well away from your nipple and your baby's mouth.  Stroke your  baby's lips gently with your finger or nipple.  When your baby's mouth is open wide enough, quickly bring your baby to your breast, placing your entire nipple and as much of the areola as possible into your baby's mouth. The areola is the colored area around your nipple. ? More areola should be visible above your baby's upper lip than below the lower lip. ? Your baby's lips should be opened and extended outward (flanged) to ensure an adequate, comfortable latch. ? Your baby's tongue should be between his or her lower gum and your breast.  Make sure that your baby's mouth is correctly positioned around your nipple (latched). Your baby's lips should create a seal on your breast and be turned out (everted).  It is common for your baby to suck about 2-3 minutes in order to start the flow of breast milk. Latching Teaching your baby how to latch onto your breast properly is very important. An improper latch can cause nipple pain, decreased milk supply, and poor weight gain in your baby. Also, if your baby is not latched onto your nipple properly, he or she may swallow some air during feeding. This can make your baby fussy. Burping your baby when you switch breasts during the feeding can help to get rid of the air. However, teaching your baby to latch on properly is still the best way to prevent fussiness from swallowing air while breastfeeding. Signs that your baby has successfully latched onto your nipple  Silent tugging or silent  sucking, without causing you pain. Infant's lips should be extended outward (flanged).  Swallowing heard between every 3-4 sucks once your milk has started to flow (after your let-down milk reflex occurs).  Muscle movement above and in front of his or her ears while sucking.  Signs that your baby has not successfully latched onto your nipple  Sucking sounds or smacking sounds from your baby while breastfeeding.  Nipple pain.  If you think your baby has not latched on correctly, slip your finger into the corner of your baby's mouth to break the suction and place it between your baby's gums. Attempt to start breastfeeding again. Signs of successful breastfeeding Signs from your baby  Your baby will gradually decrease the number of sucks or will completely stop sucking.  Your baby will fall asleep.  Your baby's body will relax.  Your baby will retain a small amount of milk in his or her mouth.  Your baby will let go of your breast by himself or herself.  Signs from you  Breasts that have increased in firmness, weight, and size 1-3 hours after feeding.  Breasts that are softer immediately after breastfeeding.  Increased milk volume, as well as a change in milk consistency and color by the fifth day of breastfeeding.  Nipples that are not sore, cracked, or bleeding.  Signs that your baby is getting enough milk  Wetting at least 1-2 diapers during the first 24 hours after birth.  Wetting at least 5-6 diapers every 24 hours for the first week after birth. The urine should be clear or pale yellow by the age of 5 days.  Wetting 6-8 diapers every 24 hours as your baby continues to grow and develop.  At least 3 stools in a 24-hour period by the age of 5 days. The stool should be soft and yellow.  At least 3 stools in a 24-hour period by the age of 7 days. The stool should be seedy and yellow.  No loss of  weight greater than 10% of birth weight during the first 3 days of  life.  Average weight gain of 4-7 oz (113-198 g) per week after the age of 4 days.  Consistent daily weight gain by the age of 5 days, without weight loss after the age of 2 weeks. After a feeding, your baby may spit up a small amount of milk. This is normal. Breastfeeding frequency and duration Frequent feeding will help you make more milk and can prevent sore nipples and extremely full breasts (breast engorgement). Breastfeed when you feel the need to reduce the fullness of your breasts or when your baby shows signs of hunger. This is called "breastfeeding on demand." Signs that your baby is hungry include:  Increased alertness, activity, or restlessness.  Movement of the head from side to side.  Opening of the mouth when the corner of the mouth or cheek is stroked (rooting).  Increased sucking sounds, smacking lips, cooing, sighing, or squeaking.  Hand-to-mouth movements and sucking on fingers or hands.  Fussing or crying.  Avoid introducing a pacifier to your baby in the first 4-6 weeks after your baby is born. After this time, you may choose to use a pacifier. Research has shown that pacifier use during the first year of a baby's life decreases the risk of sudden infant death syndrome (SIDS). Allow your baby to feed on each breast as long as he or she wants. When your baby unlatches or falls asleep while feeding from the first breast, offer the second breast. Because newborns are often sleepy in the first few weeks of life, you may need to awaken your baby to get him or her to feed. Breastfeeding times will vary from baby to baby. However, the following rules can serve as a guide to help you make sure that your baby is properly fed:  Newborns (babies 59 weeks of age or younger) may breastfeed every 1-3 hours.  Newborns should not go without breastfeeding for longer than 3 hours during the day or 5 hours during the night.  You should breastfeed your baby a minimum of 8 times in a  24-hour period.  Breast milk pumping Pumping and storing breast milk allows you to make sure that your baby is exclusively fed your breast milk, even at times when you are unable to breastfeed. This is especially important if you go back to work while you are still breastfeeding, or if you are not able to be present during feedings. Your lactation consultant can help you find a method of pumping that works best for you and give you guidelines about how long it is safe to store breast milk. Caring for your breasts while you breastfeed Nipples can become dry, cracked, and sore while breastfeeding. The following recommendations can help keep your breasts moisturized and healthy:  Avoid using soap on your nipples.  Wear a supportive bra designed especially for nursing. Avoid wearing underwire-style bras or extremely tight bras (sports bras).  Air-dry your nipples for 3-4 minutes after each feeding.  Use only cotton bra pads to absorb leaked breast milk. Leaking of breast milk between feedings is normal.  Use lanolin on your nipples after breastfeeding. Lanolin helps to maintain your skin's normal moisture barrier. Pure lanolin is not harmful (not toxic) to your baby. You may also hand express a few drops of breast milk and gently massage that milk into your nipples and allow the milk to air-dry.  In the first few weeks after giving birth, some women  experience breast engorgement. Engorgement can make your breasts feel heavy, warm, and tender to the touch. Engorgement peaks within 3-5 days after you give birth. The following recommendations can help to ease engorgement:  Completely empty your breasts while breastfeeding or pumping. You may want to start by applying warm, moist heat (in the shower or with warm, water-soaked hand towels) just before feeding or pumping. This increases circulation and helps the milk flow. If your baby does not completely empty your breasts while breastfeeding, pump any  extra milk after he or she is finished.  Apply ice packs to your breasts immediately after breastfeeding or pumping, unless this is too uncomfortable for you. To do this: ? Put ice in a plastic bag. ? Place a towel between your skin and the bag. ? Leave the ice on for 20 minutes, 2-3 times a day.  Make sure that your baby is latched on and positioned properly while breastfeeding.  If engorgement persists after 48 hours of following these recommendations, contact your health care provider or a Science writer. Overall health care recommendations while breastfeeding  Eat 3 healthy meals and 3 snacks every day. Well-nourished mothers who are breastfeeding need an additional 450-500 calories a day. You can meet this requirement by increasing the amount of a balanced diet that you eat.  Drink enough water to keep your urine pale yellow or clear.  Rest often, relax, and continue to take your prenatal vitamins to prevent fatigue, stress, and low vitamin and mineral levels in your body (nutrient deficiencies).  Do not use any products that contain nicotine or tobacco, such as cigarettes and e-cigarettes. Your baby may be harmed by chemicals from cigarettes that pass into breast milk and exposure to secondhand smoke. If you need help quitting, ask your health care provider.  Avoid alcohol.  Do not use illegal drugs or marijuana.  Talk with your health care provider before taking any medicines. These include over-the-counter and prescription medicines as well as vitamins and herbal supplements. Some medicines that may be harmful to your baby can pass through breast milk.  It is possible to become pregnant while breastfeeding. If birth control is desired, ask your health care provider about options that will be safe while breastfeeding your baby. Where to find more information: [employer]: www.llli.org Contact a health care provider if:  You feel like you want to stop  breastfeeding or have become frustrated with breastfeeding.  Your nipples are cracked or bleeding.  Your breasts are red, tender, or warm.  You have: ? Painful breasts or nipples. ? A swollen area on either breast. ? A fever or chills. ? Nausea or vomiting. ? Drainage other than breast milk from your nipples.  Your breasts do not become full before feedings by the fifth day after you give birth.  You feel sad and depressed.  Your baby is: ? Too sleepy to eat well. ? Having trouble sleeping. ? More than 62 week old and wetting fewer than 6 diapers in a 24-hour period. ? Not gaining weight by 49 days of age.  Your baby has fewer than 3 stools in a 24-hour period.  Your baby's skin or the white parts of his or her eyes become yellow. Get help right away if:  Your baby is overly tired (lethargic) and does not want to wake up and feed.  Your baby develops an unexplained fever. Summary  Breastfeeding offers many health benefits for infant and mothers.  Try to breastfeed your infant  when he or she shows early signs of hunger.  Gently tickle or stroke your baby's lips with your finger or nipple to allow the baby to open his or her mouth. Bring the baby to your breast. Make sure that much of the areola is in your baby's mouth. Offer one side and burp the baby before you offer the other side.  Talk with your health care provider or lactation consultant if you have questions or you face problems as you breastfeed. This information is not intended to replace advice given to you by your health care provider. Make sure you discuss any questions you have with your health care provider. Document Released: 05/03/2005 Document Revised: 06/04/2016 Document Reviewed: 06/04/2016 Elsevier Interactive Patient Education  2018 [employer]. [facility]  [place], [place], [place] [address]  Phone: [phone]   [facility] (second  location)  [address] [place], [place] [address]  Phone: [phone]   [facility]) [place], [place], [place] [address] Phone: [phone]   [address] [place], [place] [address]  Phone: [phone] Medications Safe in Pregnancy  Acne:      Constipation:  Benzoyl Peroxide     Colace  Clindamycin      Dulcolax Suppository  Topica Erythromycin     Fibercon  Salicylic Acid      Metamucil         Miralax AVOID:        Senakot   Accutane    Cough:  Retin-A       Cough Drops  Tetracycline      Phenergan w/ Codeine if Rx  Minocycline      Robitussin (Plain & DM)  Antibiotics:     Crabs/Lice:  Ceclor       RID  Cephalosporins    AVOID:  E-Mycins      Kwell  Keflex  Macrobid/Macrodantin   Diarrhea:  Penicillin      Kao-Pectate  Zithromax      Imodium AD         PUSH FLUIDS AVOID:       Cipro     Fever:  Tetracycline      Tylenol (Regular or Extra  Minocycline       Strength)  Levaquin      Extra Strength-Do not          Exceed 8 tabs/24 hrs Caffeine:        <249m/day (equiv. To 1 cup of coffee or  approx. 3 12 oz sodas)         Gas: Cold/Hayfever:       Gas-X  Benadryl      Mylicon  Claritin       Phazyme  **Claritin-D        Chlor-Trimeton    Headaches:  Dimetapp      ASA-Free Excedrin  Drixoral-Non-Drowsy     Cold Compress  Mucinex (Guaifenasin)     Tylenol (Regular or Extra  Sudafed/Sudafed-12 Hour     Strength)  **Sudafed PE Pseudoephedrine   Tylenol Cold & Sinus     Vicks Vapor Rub  Zyrtec  **AVOID if Problems With Blood Pressure         Heartburn: Avoid lying down for at least 1 hour after meals  Aciphex      Maalox     Rash:  Milk of Magnesia     Benadryl  Mylanta       1% Hydrocortisone Cream  Pepcid  Pepcid Complete   Sleep Aids:  Prevacid      Ambien   Prilosec       Benadryl  Rolaids       Chamomile Tea  Tums (Limit 4/day)     Unisom  Zantac       Tylenol  PM         Warm milk-add vanilla or  Hemorrhoids:       Sugar for taste  Anusol/Anusol H.C.  (RX: Analapram 2.5%)  Sugar Substitutes:  Hydrocortisone OTC     Ok in moderation  Preparation H      Tucks        Vaseline lotion applied to tissue with wiping    Herpes:     Throat:  Acyclovir      Oragel  Famvir  Valtrex     Vaccines:         Flu Shot Leg Cramps:       *Gardasil  Benadryl      Hepatitis A         Hepatitis B Nasal Spray:       Pneumovax  Saline Nasal Spray     Polio Booster         Tetanus Nausea:       Tuberculosis test or PPD  Vitamin B6 25 mg TID   AVOID:    Dramamine      *Gardasil  Emetrol       Live Poliovirus  Ginger Root 250 mg QID    MMR (measles, mumps &  High Complex Carbs @ Bedtime    rebella)  Sea Bands-Accupressure    Varicella (Chickenpox)  Unisom 1/2 tab TID     *No known complications           If received before Pain:         Known pregnancy;   Darvocet       Resume series after  Lortab        Delivery  Percocet    Yeast:   Tramadol      Femstat  Tylenol 3      Gyne-lotrimin  Ultram       Monistat  Vicodin           MISC:         All Sunscreens           Hair Coloring/highlights          Insect Repellant's          (Including DEET)         Mystic Tans Third Trimester of Pregnancy The third trimester is from week 29 through week 42, months 7 through 9. This trimester is when your unborn baby (fetus) is growing very fast. At the end of the ninth month, the unborn baby is about 20 inches in length. It weighs about 6-10 pounds. Follow these instructions at home:  Avoid all smoking, herbs, and alcohol. Avoid drugs not approved by your doctor.  Do not use any tobacco products, including cigarettes, chewing tobacco, and electronic cigarettes. If you need help quitting, ask your doctor. You may get counseling or other support to help you quit.  Only take medicine as told by your doctor. Some medicines are safe and some are not during  pregnancy.  Exercise only as told by your doctor. Stop exercising if you start having cramps.  Eat regular, healthy meals.  Wear a  good support bra if your breasts are tender.  Do not use hot tubs, steam rooms, or saunas.  Wear your seat belt when driving.  Avoid raw meat, uncooked cheese, and liter boxes and soil used by cats.  Take your prenatal vitamins.  Take 1500-2000 milligrams of calcium daily starting at the 20th week of pregnancy until you deliver your baby.  Try taking medicine that helps you poop (stool softener) as needed, and if your doctor approves. Eat more fiber by eating fresh fruit, vegetables, and whole grains. Drink enough fluids to keep your pee (urine) clear or pale yellow.  Take warm water baths (sitz baths) to soothe pain or discomfort caused by hemorrhoids. Use hemorrhoid cream if your doctor approves.  If you have puffy, bulging veins (varicose veins), wear support hose. Raise (elevate) your feet for 15 minutes, 3-4 times a day. Limit salt in your diet.  Avoid heavy lifting, wear low heels, and sit up straight.  Rest with your legs raised if you have leg cramps or low back pain.  Visit your dentist if you have not gone during your pregnancy. Use a soft toothbrush to brush your teeth. Be gentle when you floss.  You can have sex (intercourse) unless your doctor tells you not to.  Do not travel far distances unless you must. Only do so with your doctor's approval.  Take prenatal classes.  Practice driving to the hospital.  Pack your hospital bag.  Prepare the baby's room.  Go to your doctor visits. Get help if:  You are not sure if you are in labor or if your water has broken.  You are dizzy.  You have mild cramps or pressure in your lower belly (abdominal).  You have a nagging pain in your belly area.  You continue to feel sick to your stomach (nauseous), throw up (vomit), or have watery poop (diarrhea).  You have bad smelling fluid  coming from your vagina.  You have pain with peeing (urination). Get help right away if:  You have a fever.  You are leaking fluid from your vagina.  You are spotting or bleeding from your vagina.  You have severe belly cramping or pain.  You lose or gain weight rapidly.  You have trouble catching your breath and have chest pain.  You notice sudden or extreme puffiness (swelling) of your face, hands, ankles, feet, or legs.  You have not felt the baby move in over an hour.  You have severe headaches that do not go away with medicine.  You have vision changes. This information is not intended to replace advice given to you by your health care provider. Make sure you discuss any questions you have with your health care provider. Document Released: 07/28/2009 Document Revised: 10/09/2015 Document Reviewed: 07/04/2012 Elsevier Interactive Patient Education  2017 [employer].

## 2017-12-02 LAB — CBC
HEMATOCRIT: 33.5 % — AB (ref 34.0–46.6)
Hemoglobin: 11.1 g/dL (ref 11.1–15.9)
MCH: 31.5 pg (ref 26.6–33.0)
MCHC: 33.1 g/dL (ref 31.5–35.7)
MCV: 95 fL (ref 79–97)
PLATELETS: 305 10*3/uL (ref 150–450)
RBC: 3.52 x10E6/uL — ABNORMAL LOW (ref 3.77–5.28)
RDW: 12.9 % (ref 12.3–15.4)
WBC: 10.1 10*3/uL (ref 3.4–10.8)

## 2017-12-02 LAB — RPR: RPR: NONREACTIVE

## 2017-12-02 LAB — GLUCOSE, 1 HOUR GESTATIONAL: Gestational Diabetes Screen: 68 mg/dL (ref 65–139)

## 2017-12-03 NOTE — Progress Notes (Signed)
ROB-Doing well. 28 week labs today. TDaP given. Blood transfusion consent reviewed and signed. Enrolled in classes. Desires epidural. Plans breastfeeding. Third trimester education; see AVS. Anticipatory guidance regarding course of prenatal care. Reviewed red flag symptoms and when to call. RTC x 2 weeks for ROB or sooner if needed.

## 2017-12-14 ENCOUNTER — Encounter: Payer: Managed Care, Other (non HMO) | Admitting: Certified Nurse Midwife

## 2017-12-16 ENCOUNTER — Encounter: Payer: Managed Care, Other (non HMO) | Admitting: Certified Nurse Midwife

## 2017-12-20 ENCOUNTER — Ambulatory Visit (INDEPENDENT_AMBULATORY_CARE_PROVIDER_SITE_OTHER): Payer: Managed Care, Other (non HMO) | Admitting: Certified Nurse Midwife

## 2017-12-20 ENCOUNTER — Encounter: Payer: Self-pay | Admitting: Certified Nurse Midwife

## 2017-12-20 VITALS — BP 113/58 | HR 87 | Wt 177.4 lb

## 2017-12-20 DIAGNOSIS — Z3493 Encounter for supervision of normal pregnancy, unspecified, third trimester: Secondary | ICD-10-CM

## 2017-12-20 LAB — POCT URINALYSIS DIPSTICK
Bilirubin, UA: NEGATIVE
Glucose, UA: NEGATIVE
KETONES UA: NEGATIVE
Leukocytes, UA: NEGATIVE
Nitrite, UA: NEGATIVE
PH UA: 6 (ref 5.0–8.0)
PROTEIN UA: NEGATIVE
RBC UA: NEGATIVE
SPEC GRAV UA: 1.01 (ref 1.010–1.025)
UROBILINOGEN UA: 0.2 U/dL

## 2017-12-20 NOTE — Progress Notes (Signed)
Pt is here for an ROB visit. 

## 2017-12-20 NOTE — Patient Instructions (Signed)

## 2017-12-20 NOTE — Progress Notes (Signed)
Rob,doing well,good fetal movement. Anticipatory guidance given on future prenatal visits in addition to 36 week cultures.Patient wants a depo provera postpartum. ROBx 1 week.  Fabion Gatson,CNM/Shanika Creacy,SNM

## 2018-01-05 ENCOUNTER — Ambulatory Visit (INDEPENDENT_AMBULATORY_CARE_PROVIDER_SITE_OTHER): Payer: Self-pay | Admitting: Certified Nurse Midwife

## 2018-01-05 VITALS — BP 108/67 | HR 84 | Wt 183.3 lb

## 2018-01-05 DIAGNOSIS — Z3493 Encounter for supervision of normal pregnancy, unspecified, third trimester: Secondary | ICD-10-CM

## 2018-01-05 LAB — POCT URINALYSIS DIPSTICK OB
Bilirubin, UA: NEGATIVE
Blood, UA: NEGATIVE
Glucose, UA: NEGATIVE — AB
Ketones, UA: NEGATIVE
Leukocytes, UA: NEGATIVE
NITRITE UA: NEGATIVE
PH UA: 6.5 (ref 5.0–8.0)
PROTEIN: NEGATIVE
SPEC GRAV UA: 1.01 (ref 1.010–1.025)
Urobilinogen, UA: 0.2 E.U./dL

## 2018-01-05 NOTE — Progress Notes (Signed)
ROB- doing well. Desires epidural in labor. Labor support to include father of the baby and doula. Anticipatory guidance given on 36 week cultures and course of prenatal care. Reviewed red flag symptoms and when to call. RTC x 1 week or sooner if needed.

## 2018-01-05 NOTE — Progress Notes (Signed)
Pt is here for an ROB visit. 

## 2018-01-05 NOTE — Patient Instructions (Signed)
Vaginal Delivery Vaginal delivery means that you will give birth by pushing your baby out of your birth canal (vagina). A team of health care providers will help you before, during, and after vaginal delivery. Birth experiences are unique for every woman and every pregnancy, and birth experiences vary depending on where you choose to give birth. What should I do to prepare for my baby's birth? Before your baby is born, it is important to talk with your health care provider about:  Your labor and delivery preferences. These may include: ? Medicines that you may be given. ? How you will manage your pain. This might include non-medical pain relief techniques or injectable pain relief such as epidural analgesia. ? How you and your baby will be monitored during labor and delivery. ? Who may be in the labor and delivery room with you. ? Your feelings about surgical delivery of your baby (cesarean delivery, or C-section) if this becomes necessary. ? Your feelings about receiving donated blood through an IV tube (blood transfusion) if this becomes necessary.  Whether you are able: ? To take pictures or videos of the birth. ? To eat during labor and delivery. ? To move around, walk, or change positions during labor and delivery.  What to expect after your baby is born, such as: ? Whether delayed umbilical cord clamping and cutting is offered. ? Who will care for your baby right after birth. ? Medicines or tests that may be recommended for your baby. ? Whether breastfeeding is supported in your hospital or birth center. ? How long you will be in the hospital or birth center.  How any medical conditions you have may affect your baby or your labor and delivery experience.  To prepare for your baby's birth, you should also:  Attend all of your health care visits before delivery (prenatal visits) as recommended by your health care provider. This is important.  Prepare your home for your baby's  arrival. Make sure that you have: ? Diapers. ? Baby clothing. ? Feeding equipment. ? Safe sleeping arrangements for you and your baby.  Install a car seat in your vehicle. Have your car seat checked by a certified car seat installer to make sure that it is installed safely.  Think about who will help you with your new baby at home for at least the first several weeks after delivery.  What can I expect when I arrive at the birth center or hospital? Once you are in labor and have been admitted into the hospital or birth center, your health care provider may:  Review your pregnancy history and any concerns you have.  Insert an IV tube into one of your veins. This is used to give you fluids and medicines.  Check your blood pressure, pulse, temperature, and heart rate (vital signs).  Check whether your bag of water (amniotic sac) has broken (ruptured).  Talk with you about your birth plan and discuss pain control options.  Monitoring Your health care provider may monitor your contractions (uterine monitoring) and your baby's heart rate (fetal monitoring). You may need to be monitored:  Often, but not continuously (intermittently).  All the time or for long periods at a time (continuously). Continuous monitoring may be needed if: ? You are taking certain medicines, such as medicine to relieve pain or make your contractions stronger. ? You have pregnancy or labor complications.  Monitoring may be done by:  Placing a special stethoscope or a handheld monitoring device on your abdomen to   check your baby's heartbeat, and feeling your abdomen for contractions. This method of monitoring does not continuously record your baby's heartbeat or your contractions.  Placing monitors on your abdomen (external monitors) to record your baby's heartbeat and the frequency and length of contractions. You may not have to wear external monitors all the time.  Placing monitors inside of your uterus  (internal monitors) to record your baby's heartbeat and the frequency, length, and strength of your contractions. ? Your health care provider may use internal monitors if he or she needs more information about the strength of your contractions or your baby's heart rate. ? Internal monitors are put in place by passing a thin, flexible wire through your vagina and into your uterus. Depending on the type of monitor, it may remain in your uterus or on your baby's head until birth. ? Your health care provider will discuss the benefits and risks of internal monitoring with you and will ask for your permission before inserting the monitors.  Telemetry. This is a type of continuous monitoring that can be done with external or internal monitors. Instead of having to stay in bed, you are able to move around during telemetry. Ask your health care provider if telemetry is an option for you.  Physical exam Your health care provider may perform a physical exam. This may include:  Checking whether your baby is positioned: ? With the head toward your vagina (head-down). This is most common. ? With the head toward the top of your uterus (head-up or breech). If your baby is in a breech position, your health care provider may try to turn your baby to a head-down position so you can deliver vaginally. If it does not seem that your baby can be born vaginally, your provider may recommend surgery to deliver your baby. In rare cases, you may be able to deliver vaginally if your baby is head-up (breech delivery). ? Lying sideways (transverse). Babies that are lying sideways cannot be delivered vaginally.  Checking your cervix to determine: ? Whether it is thinning out (effacing). ? Whether it is opening up (dilating). ? How low your baby has moved into your birth canal.  What are the three stages of labor and delivery?  Normal labor and delivery is divided into the following three stages: Stage 1  Stage 1 is the  longest stage of labor, and it can last for hours or days. Stage 1 includes: ? Early labor. This is when contractions may be irregular, or regular and mild. Generally, early labor contractions are more than 10 minutes apart. ? Active labor. This is when contractions get longer, more regular, more frequent, and more intense. ? The transition phase. This is when contractions happen very close together, are very intense, and may last longer than during any other part of labor.  Contractions generally feel mild, infrequent, and irregular at first. They get stronger, more frequent (about every 2-3 minutes), and more regular as you progress from early labor through active labor and transition.  Many women progress through stage 1 naturally, but you may need help to continue making progress. If this happens, your health care provider may talk with you about: ? Rupturing your amniotic sac if it has not ruptured yet. ? Giving you medicine to help make your contractions stronger and more frequent.  Stage 1 ends when your cervix is completely dilated to 4 inches (10 cm) and completely effaced. This happens at the end of the transition phase. Stage 2  Once   your cervix is completely effaced and dilated to 4 inches (10 cm), you may start to feel an urge to push. It is common for the body to naturally take a rest before feeling the urge to push, especially if you received an epidural or certain other pain medicines. This rest period may last for up to 1-2 hours, depending on your unique labor experience.  During stage 2, contractions are generally less painful, because pushing helps relieve contraction pain. Instead of contraction pain, you may feel stretching and burning pain, especially when the widest part of your baby's head passes through the vaginal opening (crowning).  Your health care provider will closely monitor your pushing progress and your baby's progress through the vagina during stage 2.  Your  health care provider may massage the area of skin between your vaginal opening and anus (perineum) or apply warm compresses to your perineum. This helps it stretch as the baby's head starts to crown, which can help prevent perineal tearing. ? In some cases, an incision may be made in your perineum (episiotomy) to allow the baby to pass through the vaginal opening. An episiotomy helps to make the opening of the vagina larger to allow more room for the baby to fit through.  It is very important to breathe and focus so your health care provider can control the delivery of your baby's head. Your health care provider may have you decrease the intensity of your pushing, to help prevent perineal tearing.  After delivery of your baby's head, the shoulders and the rest of the body generally deliver very quickly and without difficulty.  Once your baby is delivered, the umbilical cord may be cut right away, or this may be delayed for 1-2 minutes, depending on your baby's health. This may vary among health care providers, hospitals, and birth centers.  If you and your baby are healthy enough, your baby may be placed on your chest or abdomen to help maintain the baby's temperature and to help you bond with each other. Some mothers and babies start breastfeeding at this time. Your health care team will dry your baby and help keep your baby warm during this time.  Your baby may need immediate care if he or she: ? Showed signs of distress during labor. ? Has a medical condition. ? Was born too early (prematurely). ? Had a bowel movement before birth (meconium). ? Shows signs of difficulty transitioning from being inside the uterus to being outside of the uterus. If you are planning to breastfeed, your health care team will help you begin a feeding. Stage 3  The third stage of labor starts immediately after the birth of your baby and ends after you deliver the placenta. The placenta is an organ that develops  during pregnancy to provide oxygen and nutrients to your baby in the womb.  Delivering the placenta may require some pushing, and you may have mild contractions. Breastfeeding can stimulate contractions to help you deliver the placenta.  After the placenta is delivered, your uterus should tighten (contract) and become firm. This helps to stop bleeding in your uterus. To help your uterus contract and to control bleeding, your health care provider may: ? Give you medicine by injection, through an IV tube, by mouth, or through your rectum (rectally). ? Massage your abdomen or perform a vaginal exam to remove any blood clots that are left in your uterus. ? Empty your bladder by placing a thin, flexible tube (catheter) into your bladder. ? Encourage   you to breastfeed your baby. After labor is over, you and your baby will be monitored closely to ensure that you are both healthy until you are ready to go home. Your health care team will teach you how to care for yourself and your baby. This information is not intended to replace advice given to you by your health care provider. Make sure you discuss any questions you have with your health care provider. Document Released: 02/10/2008 Document Revised: 11/21/2015 Document Reviewed: 05/18/2015 Elsevier Interactive Patient Education  2018 [employer]  

## 2018-01-11 ENCOUNTER — Ambulatory Visit (INDEPENDENT_AMBULATORY_CARE_PROVIDER_SITE_OTHER): Payer: Medicaid Other | Admitting: Certified Nurse Midwife

## 2018-01-11 VITALS — BP 116/72 | HR 99 | Wt 185.2 lb

## 2018-01-11 DIAGNOSIS — Z3493 Encounter for supervision of normal pregnancy, unspecified, third trimester: Secondary | ICD-10-CM

## 2018-01-11 LAB — POCT URINALYSIS DIPSTICK OB
Bilirubin, UA: NEGATIVE
Blood, UA: NEGATIVE
Glucose, UA: NEGATIVE
Ketones, UA: NEGATIVE
LEUKOCYTES UA: NEGATIVE
NITRITE UA: NEGATIVE
PH UA: 7 (ref 5.0–8.0)
PROTEIN: NEGATIVE
Spec Grav, UA: 1.01 (ref 1.010–1.025)
UROBILINOGEN UA: 0.2 U/dL

## 2018-01-11 NOTE — Patient Instructions (Signed)
Braxton Hicks Contractions °Contractions of the uterus can occur throughout pregnancy, but they are not always a sign that you are in labor. You may have practice contractions called Braxton Hicks contractions. These false labor contractions are sometimes confused with true labor. °What are Braxton Hicks contractions? °Braxton Hicks contractions are tightening movements that occur in the muscles of the uterus before labor. Unlike true labor contractions, these contractions do not result in opening (dilation) and thinning of the cervix. Toward the end of pregnancy (32-34 weeks), Braxton Hicks contractions can happen more often and may become stronger. These contractions are sometimes difficult to tell apart from true labor because they can be very uncomfortable. You should not feel embarrassed if you go to the hospital with false labor. °Sometimes, the only way to tell if you are in true labor is for your health care provider to look for changes in the cervix. The health care provider will do a physical exam and may monitor your contractions. If you are not in true labor, the exam should show that your cervix is not dilating and your water has not broken. °If there are other health problems associated with your pregnancy, it is completely safe for you to be sent home with false labor. You may continue to have Braxton Hicks contractions until you go into true labor. °How to tell the difference between true labor and false labor °True labor °· Contractions last 30-70 seconds. °· Contractions become very regular. °· Discomfort is usually felt in the top of the uterus, and it spreads to the lower abdomen and low back. °· Contractions do not go away with walking. °· Contractions usually become more intense and increase in frequency. °· The cervix dilates and gets thinner. °False labor °· Contractions are usually shorter and not as strong as true labor contractions. °· Contractions are usually irregular. °· Contractions  are often felt in the front of the lower abdomen and in the groin. °· Contractions may go away when you walk around or change positions while lying down. °· Contractions get weaker and are shorter-lasting as time goes on. °· The cervix usually does not dilate or become thin. °Follow these instructions at home: °· Take over-the-counter and prescription medicines only as told by your health care provider. °· Keep up with your usual exercises and follow other instructions from your health care provider. °· Eat and drink lightly if you think you are going into labor. °· If Braxton Hicks contractions are making you uncomfortable: °? Change your position from lying down or resting to walking, or change from walking to resting. °? Sit and rest in a tub of warm water. °? Drink enough fluid to keep your urine pale yellow. Dehydration may cause these contractions. °? Do slow and deep breathing several times an hour. °· Keep all follow-up prenatal visits as told by your health care provider. This is important. °Contact a health care provider if: °· You have a fever. °· You have continuous pain in your abdomen. °Get help right away if: °· Your contractions become stronger, more regular, and closer together. °· You have fluid leaking or gushing from your vagina. °· You pass blood-tinged mucus (bloody show). °· You have bleeding from your vagina. °· You have low back pain that you never had before. °· You feel your baby’s head pushing down and causing pelvic pressure. °· Your baby is not moving inside you as much as it used to. °Summary °· Contractions that occur before labor are called Braxton   Hicks contractions, false labor, or practice contractions. °· Braxton Hicks contractions are usually shorter, weaker, farther apart, and less regular than true labor contractions. True labor contractions usually become progressively stronger and regular and they become more frequent. °· Manage discomfort from Braxton Hicks contractions by  changing position, resting in a warm bath, drinking plenty of water, or practicing deep breathing. °This information is not intended to replace advice given to you by your health care provider. Make sure you discuss any questions you have with your health care provider. °Document Released: 09/16/2016 Document Revised: 09/16/2016 Document Reviewed: 09/16/2016 °Elsevier Interactive Patient Education © 2018 [employer] ° °

## 2018-01-11 NOTE — Progress Notes (Signed)
Pt is here for an ROB visit.Is due for cultures. 

## 2018-01-11 NOTE — Progress Notes (Signed)
ROB doing well. Feels good movement. GBS and Cultures today. SVE per pt request 2/60/-2. Discussed labor precautions. Follow up 1 wk.   Doreene BurkeAnnie Reene Harlacher, CNM

## 2018-01-13 LAB — GC/CHLAMYDIA PROBE AMP
Chlamydia trachomatis, NAA: NEGATIVE
Neisseria gonorrhoeae by PCR: NEGATIVE

## 2018-01-13 LAB — STREP GP B NAA: STREP GROUP B AG: POSITIVE — AB

## 2018-01-20 ENCOUNTER — Ambulatory Visit (INDEPENDENT_AMBULATORY_CARE_PROVIDER_SITE_OTHER): Payer: Medicaid Other | Admitting: Certified Nurse Midwife

## 2018-01-20 VITALS — BP 115/67 | HR 76 | Wt 188.8 lb

## 2018-01-20 DIAGNOSIS — Z3493 Encounter for supervision of normal pregnancy, unspecified, third trimester: Secondary | ICD-10-CM

## 2018-01-20 LAB — POCT URINALYSIS DIPSTICK OB
Bilirubin, UA: NEGATIVE
GLUCOSE, UA: NEGATIVE
Ketones, UA: NEGATIVE
LEUKOCYTES UA: NEGATIVE
Nitrite, UA: NEGATIVE
POC,PROTEIN,UA: NEGATIVE
RBC UA: NEGATIVE
Spec Grav, UA: 1.01 (ref 1.010–1.025)
Urobilinogen, UA: 0.2 E.U./dL
pH, UA: 6 (ref 5.0–8.0)

## 2018-01-20 NOTE — Progress Notes (Signed)
ROB- pt is doing well 

## 2018-01-20 NOTE — Patient Instructions (Signed)
Vaginal Delivery Vaginal delivery means that you will give birth by pushing your baby out of your birth canal (vagina). A team of health care providers will help you before, during, and after vaginal delivery. Birth experiences are unique for every woman and every pregnancy, and birth experiences vary depending on where you choose to give birth. What should I do to prepare for my baby's birth? Before your baby is born, it is important to talk with your health care provider about:  Your labor and delivery preferences. These may include: ? Medicines that you may be given. ? How you will manage your pain. This might include non-medical pain relief techniques or injectable pain relief such as epidural analgesia. ? How you and your baby will be monitored during labor and delivery. ? Who may be in the labor and delivery room with you. ? Your feelings about surgical delivery of your baby (cesarean delivery, or C-section) if this becomes necessary. ? Your feelings about receiving donated blood through an IV tube (blood transfusion) if this becomes necessary.  Whether you are able: ? To take pictures or videos of the birth. ? To eat during labor and delivery. ? To move around, walk, or change positions during labor and delivery.  What to expect after your baby is born, such as: ? Whether delayed umbilical cord clamping and cutting is offered. ? Who will care for your baby right after birth. ? Medicines or tests that may be recommended for your baby. ? Whether breastfeeding is supported in your hospital or birth center. ? How long you will be in the hospital or birth center.  How any medical conditions you have may affect your baby or your labor and delivery experience.  To prepare for your baby's birth, you should also:  Attend all of your health care visits before delivery (prenatal visits) as recommended by your health care provider. This is important.  Prepare your home for your baby's  arrival. Make sure that you have: ? Diapers. ? Baby clothing. ? Feeding equipment. ? Safe sleeping arrangements for you and your baby.  Install a car seat in your vehicle. Have your car seat checked by a certified car seat installer to make sure that it is installed safely.  Think about who will help you with your new baby at home for at least the first several weeks after delivery.  What can I expect when I arrive at the birth center or hospital? Once you are in labor and have been admitted into the hospital or birth center, your health care provider may:  Review your pregnancy history and any concerns you have.  Insert an IV tube into one of your veins. This is used to give you fluids and medicines.  Check your blood pressure, pulse, temperature, and heart rate (vital signs).  Check whether your bag of water (amniotic sac) has broken (ruptured).  Talk with you about your birth plan and discuss pain control options.  Monitoring Your health care provider may monitor your contractions (uterine monitoring) and your baby's heart rate (fetal monitoring). You may need to be monitored:  Often, but not continuously (intermittently).  All the time or for long periods at a time (continuously). Continuous monitoring may be needed if: ? You are taking certain medicines, such as medicine to relieve pain or make your contractions stronger. ? You have pregnancy or labor complications.  Monitoring may be done by:  Placing a special stethoscope or a handheld monitoring device on your abdomen to   check your baby's heartbeat, and feeling your abdomen for contractions. This method of monitoring does not continuously record your baby's heartbeat or your contractions.  Placing monitors on your abdomen (external monitors) to record your baby's heartbeat and the frequency and length of contractions. You may not have to wear external monitors all the time.  Placing monitors inside of your uterus  (internal monitors) to record your baby's heartbeat and the frequency, length, and strength of your contractions. ? Your health care provider may use internal monitors if he or she needs more information about the strength of your contractions or your baby's heart rate. ? Internal monitors are put in place by passing a thin, flexible wire through your vagina and into your uterus. Depending on the type of monitor, it may remain in your uterus or on your baby's head until birth. ? Your health care provider will discuss the benefits and risks of internal monitoring with you and will ask for your permission before inserting the monitors.  Telemetry. This is a type of continuous monitoring that can be done with external or internal monitors. Instead of having to stay in bed, you are able to move around during telemetry. Ask your health care provider if telemetry is an option for you.  Physical exam Your health care provider may perform a physical exam. This may include:  Checking whether your baby is positioned: ? With the head toward your vagina (head-down). This is most common. ? With the head toward the top of your uterus (head-up or breech). If your baby is in a breech position, your health care provider may try to turn your baby to a head-down position so you can deliver vaginally. If it does not seem that your baby can be born vaginally, your provider may recommend surgery to deliver your baby. In rare cases, you may be able to deliver vaginally if your baby is head-up (breech delivery). ? Lying sideways (transverse). Babies that are lying sideways cannot be delivered vaginally.  Checking your cervix to determine: ? Whether it is thinning out (effacing). ? Whether it is opening up (dilating). ? How low your baby has moved into your birth canal.  What are the three stages of labor and delivery?  Normal labor and delivery is divided into the following three stages: Stage 1  Stage 1 is the  longest stage of labor, and it can last for hours or days. Stage 1 includes: ? Early labor. This is when contractions may be irregular, or regular and mild. Generally, early labor contractions are more than 10 minutes apart. ? Active labor. This is when contractions get longer, more regular, more frequent, and more intense. ? The transition phase. This is when contractions happen very close together, are very intense, and may last longer than during any other part of labor.  Contractions generally feel mild, infrequent, and irregular at first. They get stronger, more frequent (about every 2-3 minutes), and more regular as you progress from early labor through active labor and transition.  Many women progress through stage 1 naturally, but you may need help to continue making progress. If this happens, your health care provider may talk with you about: ? Rupturing your amniotic sac if it has not ruptured yet. ? Giving you medicine to help make your contractions stronger and more frequent.  Stage 1 ends when your cervix is completely dilated to 4 inches (10 cm) and completely effaced. This happens at the end of the transition phase. Stage 2  Once   your cervix is completely effaced and dilated to 4 inches (10 cm), you may start to feel an urge to push. It is common for the body to naturally take a rest before feeling the urge to push, especially if you received an epidural or certain other pain medicines. This rest period may last for up to 1-2 hours, depending on your unique labor experience.  During stage 2, contractions are generally less painful, because pushing helps relieve contraction pain. Instead of contraction pain, you may feel stretching and burning pain, especially when the widest part of your baby's head passes through the vaginal opening (crowning).  Your health care provider will closely monitor your pushing progress and your baby's progress through the vagina during stage 2.  Your  health care provider may massage the area of skin between your vaginal opening and anus (perineum) or apply warm compresses to your perineum. This helps it stretch as the baby's head starts to crown, which can help prevent perineal tearing. ? In some cases, an incision may be made in your perineum (episiotomy) to allow the baby to pass through the vaginal opening. An episiotomy helps to make the opening of the vagina larger to allow more room for the baby to fit through.  It is very important to breathe and focus so your health care provider can control the delivery of your baby's head. Your health care provider may have you decrease the intensity of your pushing, to help prevent perineal tearing.  After delivery of your baby's head, the shoulders and the rest of the body generally deliver very quickly and without difficulty.  Once your baby is delivered, the umbilical cord may be cut right away, or this may be delayed for 1-2 minutes, depending on your baby's health. This may vary among health care providers, hospitals, and birth centers.  If you and your baby are healthy enough, your baby may be placed on your chest or abdomen to help maintain the baby's temperature and to help you bond with each other. Some mothers and babies start breastfeeding at this time. Your health care team will dry your baby and help keep your baby warm during this time.  Your baby may need immediate care if he or she: ? Showed signs of distress during labor. ? Has a medical condition. ? Was born too early (prematurely). ? Had a bowel movement before birth (meconium). ? Shows signs of difficulty transitioning from being inside the uterus to being outside of the uterus. If you are planning to breastfeed, your health care team will help you begin a feeding. Stage 3  The third stage of labor starts immediately after the birth of your baby and ends after you deliver the placenta. The placenta is an organ that develops  during pregnancy to provide oxygen and nutrients to your baby in the womb.  Delivering the placenta may require some pushing, and you may have mild contractions. Breastfeeding can stimulate contractions to help you deliver the placenta.  After the placenta is delivered, your uterus should tighten (contract) and become firm. This helps to stop bleeding in your uterus. To help your uterus contract and to control bleeding, your health care provider may: ? Give you medicine by injection, through an IV tube, by mouth, or through your rectum (rectally). ? Massage your abdomen or perform a vaginal exam to remove any blood clots that are left in your uterus. ? Empty your bladder by placing a thin, flexible tube (catheter) into your bladder. ? Encourage   you to breastfeed your baby. After labor is over, you and your baby will be monitored closely to ensure that you are both healthy until you are ready to go home. Your health care team will teach you how to care for yourself and your baby. This information is not intended to replace advice given to you by your health care provider. Make sure you discuss any questions you have with your health care provider. Document Released: 02/10/2008 Document Revised: 11/21/2015 Document Reviewed: 05/18/2015 Elsevier Interactive Patient Education  2018 [employer]  

## 2018-01-22 NOTE — Progress Notes (Signed)
ROB-Doing well, no questions or concerns. Herbal prep handout given. Reviewed red flag symptoms and when to call. RTC x 1 week for ROB or sooner if needed.

## 2018-01-26 ENCOUNTER — Ambulatory Visit (INDEPENDENT_AMBULATORY_CARE_PROVIDER_SITE_OTHER): Payer: Medicaid Other | Admitting: Certified Nurse Midwife

## 2018-01-26 VITALS — BP 119/67 | HR 88 | Wt 188.2 lb

## 2018-01-26 DIAGNOSIS — Z3493 Encounter for supervision of normal pregnancy, unspecified, third trimester: Secondary | ICD-10-CM

## 2018-01-26 LAB — POCT URINALYSIS DIPSTICK OB
BILIRUBIN UA: NEGATIVE
Blood, UA: NEGATIVE
GLUCOSE, UA: NEGATIVE
Ketones, UA: NEGATIVE
LEUKOCYTES UA: NEGATIVE
Nitrite, UA: NEGATIVE
SPEC GRAV UA: 1.015 (ref 1.010–1.025)
Urobilinogen, UA: 0.2 E.U./dL
pH, UA: 6.5 (ref 5.0–8.0)

## 2018-01-26 NOTE — Patient Instructions (Signed)
Vaginal Delivery Vaginal delivery means that you will give birth by pushing your baby out of your birth canal (vagina). A team of health care providers will help you before, during, and after vaginal delivery. Birth experiences are unique for every woman and every pregnancy, and birth experiences vary depending on where you choose to give birth. What should I do to prepare for my baby's birth? Before your baby is born, it is important to talk with your health care provider about:  Your labor and delivery preferences. These may include: ? Medicines that you may be given. ? How you will manage your pain. This might include non-medical pain relief techniques or injectable pain relief such as epidural analgesia. ? How you and your baby will be monitored during labor and delivery. ? Who may be in the labor and delivery room with you. ? Your feelings about surgical delivery of your baby (cesarean delivery, or C-section) if this becomes necessary. ? Your feelings about receiving donated blood through an IV tube (blood transfusion) if this becomes necessary.  Whether you are able: ? To take pictures or videos of the birth. ? To eat during labor and delivery. ? To move around, walk, or change positions during labor and delivery.  What to expect after your baby is born, such as: ? Whether delayed umbilical cord clamping and cutting is offered. ? Who will care for your baby right after birth. ? Medicines or tests that may be recommended for your baby. ? Whether breastfeeding is supported in your hospital or birth center. ? How long you will be in the hospital or birth center.  How any medical conditions you have may affect your baby or your labor and delivery experience.  To prepare for your baby's birth, you should also:  Attend all of your health care visits before delivery (prenatal visits) as recommended by your health care provider. This is important.  Prepare your home for your baby's  arrival. Make sure that you have: ? Diapers. ? Baby clothing. ? Feeding equipment. ? Safe sleeping arrangements for you and your baby.  Install a car seat in your vehicle. Have your car seat checked by a certified car seat installer to make sure that it is installed safely.  Think about who will help you with your new baby at home for at least the first several weeks after delivery.  What can I expect when I arrive at the birth center or hospital? Once you are in labor and have been admitted into the hospital or birth center, your health care provider may:  Review your pregnancy history and any concerns you have.  Insert an IV tube into one of your veins. This is used to give you fluids and medicines.  Check your blood pressure, pulse, temperature, and heart rate (vital signs).  Check whether your bag of water (amniotic sac) has broken (ruptured).  Talk with you about your birth plan and discuss pain control options.  Monitoring Your health care provider may monitor your contractions (uterine monitoring) and your baby's heart rate (fetal monitoring). You may need to be monitored:  Often, but not continuously (intermittently).  All the time or for long periods at a time (continuously). Continuous monitoring may be needed if: ? You are taking certain medicines, such as medicine to relieve pain or make your contractions stronger. ? You have pregnancy or labor complications.  Monitoring may be done by:  Placing a special stethoscope or a handheld monitoring device on your abdomen to   check your baby's heartbeat, and feeling your abdomen for contractions. This method of monitoring does not continuously record your baby's heartbeat or your contractions.  Placing monitors on your abdomen (external monitors) to record your baby's heartbeat and the frequency and length of contractions. You may not have to wear external monitors all the time.  Placing monitors inside of your uterus  (internal monitors) to record your baby's heartbeat and the frequency, length, and strength of your contractions. ? Your health care provider may use internal monitors if he or she needs more information about the strength of your contractions or your baby's heart rate. ? Internal monitors are put in place by passing a thin, flexible wire through your vagina and into your uterus. Depending on the type of monitor, it may remain in your uterus or on your baby's head until birth. ? Your health care provider will discuss the benefits and risks of internal monitoring with you and will ask for your permission before inserting the monitors.  Telemetry. This is a type of continuous monitoring that can be done with external or internal monitors. Instead of having to stay in bed, you are able to move around during telemetry. Ask your health care provider if telemetry is an option for you.  Physical exam Your health care provider may perform a physical exam. This may include:  Checking whether your baby is positioned: ? With the head toward your vagina (head-down). This is most common. ? With the head toward the top of your uterus (head-up or breech). If your baby is in a breech position, your health care provider may try to turn your baby to a head-down position so you can deliver vaginally. If it does not seem that your baby can be born vaginally, your provider may recommend surgery to deliver your baby. In rare cases, you may be able to deliver vaginally if your baby is head-up (breech delivery). ? Lying sideways (transverse). Babies that are lying sideways cannot be delivered vaginally.  Checking your cervix to determine: ? Whether it is thinning out (effacing). ? Whether it is opening up (dilating). ? How low your baby has moved into your birth canal.  What are the three stages of labor and delivery?  Normal labor and delivery is divided into the following three stages: Stage 1  Stage 1 is the  longest stage of labor, and it can last for hours or days. Stage 1 includes: ? Early labor. This is when contractions may be irregular, or regular and mild. Generally, early labor contractions are more than 10 minutes apart. ? Active labor. This is when contractions get longer, more regular, more frequent, and more intense. ? The transition phase. This is when contractions happen very close together, are very intense, and may last longer than during any other part of labor.  Contractions generally feel mild, infrequent, and irregular at first. They get stronger, more frequent (about every 2-3 minutes), and more regular as you progress from early labor through active labor and transition.  Many women progress through stage 1 naturally, but you may need help to continue making progress. If this happens, your health care provider may talk with you about: ? Rupturing your amniotic sac if it has not ruptured yet. ? Giving you medicine to help make your contractions stronger and more frequent.  Stage 1 ends when your cervix is completely dilated to 4 inches (10 cm) and completely effaced. This happens at the end of the transition phase. Stage 2  Once   your cervix is completely effaced and dilated to 4 inches (10 cm), you may start to feel an urge to push. It is common for the body to naturally take a rest before feeling the urge to push, especially if you received an epidural or certain other pain medicines. This rest period may last for up to 1-2 hours, depending on your unique labor experience.  During stage 2, contractions are generally less painful, because pushing helps relieve contraction pain. Instead of contraction pain, you may feel stretching and burning pain, especially when the widest part of your baby's head passes through the vaginal opening (crowning).  Your health care provider will closely monitor your pushing progress and your baby's progress through the vagina during stage 2.  Your  health care provider may massage the area of skin between your vaginal opening and anus (perineum) or apply warm compresses to your perineum. This helps it stretch as the baby's head starts to crown, which can help prevent perineal tearing. ? In some cases, an incision may be made in your perineum (episiotomy) to allow the baby to pass through the vaginal opening. An episiotomy helps to make the opening of the vagina larger to allow more room for the baby to fit through.  It is very important to breathe and focus so your health care provider can control the delivery of your baby's head. Your health care provider may have you decrease the intensity of your pushing, to help prevent perineal tearing.  After delivery of your baby's head, the shoulders and the rest of the body generally deliver very quickly and without difficulty.  Once your baby is delivered, the umbilical cord may be cut right away, or this may be delayed for 1-2 minutes, depending on your baby's health. This may vary among health care providers, hospitals, and birth centers.  If you and your baby are healthy enough, your baby may be placed on your chest or abdomen to help maintain the baby's temperature and to help you bond with each other. Some mothers and babies start breastfeeding at this time. Your health care team will dry your baby and help keep your baby warm during this time.  Your baby may need immediate care if he or she: ? Showed signs of distress during labor. ? Has a medical condition. ? Was born too early (prematurely). ? Had a bowel movement before birth (meconium). ? Shows signs of difficulty transitioning from being inside the uterus to being outside of the uterus. If you are planning to breastfeed, your health care team will help you begin a feeding. Stage 3  The third stage of labor starts immediately after the birth of your baby and ends after you deliver the placenta. The placenta is an organ that develops  during pregnancy to provide oxygen and nutrients to your baby in the womb.  Delivering the placenta may require some pushing, and you may have mild contractions. Breastfeeding can stimulate contractions to help you deliver the placenta.  After the placenta is delivered, your uterus should tighten (contract) and become firm. This helps to stop bleeding in your uterus. To help your uterus contract and to control bleeding, your health care provider may: ? Give you medicine by injection, through an IV tube, by mouth, or through your rectum (rectally). ? Massage your abdomen or perform a vaginal exam to remove any blood clots that are left in your uterus. ? Empty your bladder by placing a thin, flexible tube (catheter) into your bladder. ? Encourage   you to breastfeed your baby. After labor is over, you and your baby will be monitored closely to ensure that you are both healthy until you are ready to go home. Your health care team will teach you how to care for yourself and your baby. This information is not intended to replace advice given to you by your health care provider. Make sure you discuss any questions you have with your health care provider. Document Released: 02/10/2008 Document Revised: 11/21/2015 Document Reviewed: 05/18/2015 Elsevier Interactive Patient Education  2018 [employer]  

## 2018-01-30 NOTE — Progress Notes (Signed)
ROB-Doing well, requests SVE. Flu vaccine given on 09/10 at PCP office. Herbal prep handout given. Reviewed red flag symptoms and when to call. RTC x 1 week for ROB or sooner if needed.

## 2018-02-01 ENCOUNTER — Observation Stay
Admission: EM | Admit: 2018-02-01 | Discharge: 2018-02-01 | Disposition: A | Discharge status: Home / Self Care | Payer: Medicaid Other | Attending: Certified Nurse Midwife | Admitting: Certified Nurse Midwife

## 2018-02-01 ENCOUNTER — Other Ambulatory Visit: Payer: Self-pay

## 2018-02-01 ENCOUNTER — Encounter: Payer: Self-pay | Admitting: Obstetrics and Gynecology

## 2018-02-01 DIAGNOSIS — O368130 Decreased fetal movements, third trimester, not applicable or unspecified: Principal | ICD-10-CM | POA: Insufficient documentation

## 2018-02-01 DIAGNOSIS — Z3A39 39 weeks gestation of pregnancy: Secondary | ICD-10-CM

## 2018-02-01 NOTE — Discharge Instructions (Signed)
Keep your next schedule OB appointment. Drink plenty of fluid and get plenty of rest, call your provider for any other concerns.

## 2018-02-01 NOTE — OB Triage Note (Signed)
Pt present to L&D with c/o decreased fetal movement, stating "I have not felt her move since last night, before bed, around 10:30." Pt denies vaginal bleeding, leaking of fluid, or contractions. Pt does note occasional "cramping" rating associated pain 1/10. Toco and EFM applied and explained. All questions answered. Will  monitor closely.

## 2018-02-01 NOTE — OB Triage Note (Signed)
   L&D OB Triage Note  SUBJECTIVE Ariel Ortega is a 22 y.o. G1P0000 female at 3073w0d, EDD Estimated Date of Delivery: 02/08/18 who presented to triage with complaints of decreased fetal movement.   OB History  Gravida Para Term Preterm AB Living  1 0 0 0 0 0  SAB TAB Ectopic Multiple Live Births  0 0 0 0 0    # Outcome Date GA Lbr Len/2nd Weight Sex Delivery Anes PTL Lv  1 Current             Medications Prior to Admission  Medication Sig Dispense Refill Last Dose  . Prenatal Vit-Fe Fumarate-FA (PRENATAL MULTIVITAMIN) TABS tablet Take 1 tablet by mouth daily at 12 noon.   01/31/2018 at Unknown time  . EPINEPHrine (EPIPEN) 0.3 mg/0.3 mL DEVI Inject 0.3 mLs (0.3 mg total) into the muscle once. 2 Device 1 Taking     OBJECTIVE  Nursing Evaluation:   LMP 05/15/2017 (Approximate)    Findings:   Reactive NST  NST was performed and has been reviewed by me.  NST INTERPRETATION: Category I  Mode: External Baseline Rate (A): 130 bpm Variability: Moderate Accelerations: 15 x 15 Decelerations: None     Contraction Frequency (min): occasional  ASSESSMENT Impression:  1.  Pregnancy:  G1P0000 at 4573w0d , EDD Estimated Date of Delivery: 02/08/18 2.  NST:  Category I  PLAN 1. Reassurance given 2. Discharge home with standard labor precautions given to return to L&D or call the office for problems. 3. Continue routine prenatal care.  Doreene BurkeAnnie Mccayla Shimada, CNM

## 2018-02-02 ENCOUNTER — Ambulatory Visit (INDEPENDENT_AMBULATORY_CARE_PROVIDER_SITE_OTHER): Payer: Medicaid Other | Admitting: Certified Nurse Midwife

## 2018-02-02 ENCOUNTER — Other Ambulatory Visit: Payer: Self-pay | Admitting: Certified Nurse Midwife

## 2018-02-02 VITALS — BP 99/64 | HR 96 | Wt 191.0 lb

## 2018-02-02 DIAGNOSIS — Z3493 Encounter for supervision of normal pregnancy, unspecified, third trimester: Secondary | ICD-10-CM | POA: Diagnosis not present

## 2018-02-02 DIAGNOSIS — O480 Post-term pregnancy: Secondary | ICD-10-CM

## 2018-02-02 LAB — POCT URINALYSIS DIPSTICK OB
BILIRUBIN UA: NEGATIVE
Blood, UA: NEGATIVE
Glucose, UA: NEGATIVE
Ketones, UA: NEGATIVE
Leukocytes, UA: NEGATIVE
Nitrite, UA: NEGATIVE
POC,PROTEIN,UA: NEGATIVE
Spec Grav, UA: 1.025 (ref 1.010–1.025)
UROBILINOGEN UA: 0.2 U/dL
pH, UA: 6 (ref 5.0–8.0)

## 2018-02-02 NOTE — Progress Notes (Signed)
Pt presents today for ROB visit for Cervix check. Pt has no concerns and is doing well.

## 2018-02-02 NOTE — Patient Instructions (Signed)

## 2018-02-04 ENCOUNTER — Inpatient Hospital Stay: Payer: Medicaid Other | Admitting: Anesthesiology

## 2018-02-04 ENCOUNTER — Other Ambulatory Visit: Payer: Self-pay

## 2018-02-04 ENCOUNTER — Inpatient Hospital Stay
Admission: EM | Admit: 2018-02-04 | Discharge: 2018-02-06 | DRG: 806 | Disposition: A | Discharge status: Home / Self Care | Payer: Medicaid Other | Attending: Certified Nurse Midwife | Admitting: Certified Nurse Midwife

## 2018-02-04 DIAGNOSIS — O9081 Anemia of the puerperium: Secondary | ICD-10-CM | POA: Diagnosis not present

## 2018-02-04 DIAGNOSIS — Z3A39 39 weeks gestation of pregnancy: Secondary | ICD-10-CM | POA: Diagnosis not present

## 2018-02-04 DIAGNOSIS — Z3483 Encounter for supervision of other normal pregnancy, third trimester: Secondary | ICD-10-CM | POA: Diagnosis present

## 2018-02-04 DIAGNOSIS — D62 Acute posthemorrhagic anemia: Secondary | ICD-10-CM | POA: Diagnosis not present

## 2018-02-04 DIAGNOSIS — O99824 Streptococcus B carrier state complicating childbirth: Secondary | ICD-10-CM | POA: Diagnosis present

## 2018-02-04 LAB — CBC
HEMATOCRIT: 31.8 % — AB (ref 35.0–47.0)
Hemoglobin: 11 g/dL — ABNORMAL LOW (ref 12.0–16.0)
MCH: 31.3 pg (ref 26.0–34.0)
MCHC: 34.5 g/dL (ref 32.0–36.0)
MCV: 90.7 fL (ref 80.0–100.0)
PLATELETS: 330 10*3/uL (ref 150–440)
RBC: 3.51 MIL/uL — ABNORMAL LOW (ref 3.80–5.20)
RDW: 13.6 % (ref 11.5–14.5)
WBC: 14 10*3/uL — AB (ref 3.6–11.0)

## 2018-02-04 LAB — TYPE AND SCREEN
ABO/RH(D): A POS
ANTIBODY SCREEN: NEGATIVE

## 2018-02-04 MED ORDER — MISOPROSTOL 200 MCG PO TABS
ORAL_TABLET | ORAL | Status: AC
  Filled 2018-02-04: qty 4

## 2018-02-04 MED ORDER — IBUPROFEN 600 MG PO TABS
600.0000 mg | ORAL_TABLET | Freq: Four times a day (QID) | ORAL | Status: DC
  Administered 2018-02-04 – 2018-02-06 (×8): 600 mg via ORAL
  Filled 2018-02-04 (×8): qty 1

## 2018-02-04 MED ORDER — LIDOCAINE HCL (PF) 1 % IJ SOLN: 30.0000 mL | INTRAMUSCULAR | Status: DC | PRN

## 2018-02-04 MED ORDER — LACTATED RINGERS IV SOLN: 500.0000 mL | INTRAVENOUS | Status: DC | PRN

## 2018-02-04 MED ORDER — OXYCODONE-ACETAMINOPHEN 5-325 MG PO TABS: 2.0000 | ORAL_TABLET | ORAL | Status: DC | PRN

## 2018-02-04 MED ORDER — SENNOSIDES-DOCUSATE SODIUM 8.6-50 MG PO TABS
2.0000 | ORAL_TABLET | ORAL | Status: DC
  Administered 2018-02-05 – 2018-02-06 (×2): 2 via ORAL
  Filled 2018-02-04 (×2): qty 2

## 2018-02-04 MED ORDER — PRENATAL MULTIVITAMIN CH
1.0000 | ORAL_TABLET | Freq: Every day | ORAL | Status: DC
  Administered 2018-02-05 – 2018-02-06 (×2): 1 via ORAL
  Filled 2018-02-04 (×2): qty 1

## 2018-02-04 MED ORDER — OXYTOCIN 40 UNITS IN LACTATED RINGERS INFUSION - SIMPLE MED
2.5000 [IU]/h | INTRAVENOUS | Status: DC
  Filled 2018-02-04 (×2): qty 1000

## 2018-02-04 MED ORDER — PHENYLEPHRINE 40 MCG/ML (10ML) SYRINGE FOR IV PUSH (FOR BLOOD PRESSURE SUPPORT)
80.0000 ug | PREFILLED_SYRINGE | INTRAVENOUS | Status: DC | PRN
  Filled 2018-02-04: qty 5

## 2018-02-04 MED ORDER — AMMONIA AROMATIC IN INHA
RESPIRATORY_TRACT | Status: AC
  Filled 2018-02-04: qty 10

## 2018-02-04 MED ORDER — FLEET ENEMA 7-19 GM/118ML RE ENEM: 1.0000 | ENEMA | Freq: Every day | RECTAL | Status: DC | PRN

## 2018-02-04 MED ORDER — LACTATED RINGERS IV SOLN
INTRAVENOUS | Status: DC
  Administered 2018-02-04: 09:00:00 via INTRAVENOUS

## 2018-02-04 MED ORDER — ACETAMINOPHEN 325 MG PO TABS
650.0000 mg | ORAL_TABLET | ORAL | Status: DC | PRN
  Administered 2018-02-05 – 2018-02-06 (×6): 650 mg via ORAL
  Filled 2018-02-04 (×6): qty 2

## 2018-02-04 MED ORDER — BUTORPHANOL TARTRATE 1 MG/ML IJ SOLN: 1.0000 mg | INTRAMUSCULAR | Status: DC | PRN

## 2018-02-04 MED ORDER — PENICILLIN G 3 MILLION UNITS IVPB - SIMPLE MED
3.0000 10*6.[IU] | INTRAVENOUS | Status: DC
  Filled 2018-02-04 (×7): qty 100

## 2018-02-04 MED ORDER — ONDANSETRON HCL 4 MG PO TABS: 4.0000 mg | ORAL_TABLET | ORAL | Status: DC | PRN

## 2018-02-04 MED ORDER — SODIUM CHLORIDE 0.9 % IV SOLN
INTRAVENOUS | Status: DC | PRN
  Administered 2018-02-04 (×3): 5 mL via EPIDURAL

## 2018-02-04 MED ORDER — OXYCODONE-ACETAMINOPHEN 5-325 MG PO TABS: 1.0000 | ORAL_TABLET | ORAL | Status: DC | PRN

## 2018-02-04 MED ORDER — SODIUM CHLORIDE 0.9 % IV SOLN
5.0000 10*6.[IU] | Freq: Once | INTRAVENOUS | Status: AC
  Administered 2018-02-04: 5 10*6.[IU] via INTRAVENOUS

## 2018-02-04 MED ORDER — LIDOCAINE HCL (PF) 1 % IJ SOLN
INTRAMUSCULAR | Status: DC | PRN
  Administered 2018-02-04: 1 mL

## 2018-02-04 MED ORDER — DIPHENHYDRAMINE HCL 25 MG PO CAPS: 25.0000 mg | ORAL_CAPSULE | Freq: Four times a day (QID) | ORAL | Status: DC | PRN

## 2018-02-04 MED ORDER — WITCH HAZEL-GLYCERIN EX PADS
1.0000 "application " | MEDICATED_PAD | CUTANEOUS | Status: DC | PRN
  Administered 2018-02-04: 1 via TOPICAL
  Filled 2018-02-04: qty 100

## 2018-02-04 MED ORDER — DIPHENHYDRAMINE HCL 50 MG/ML IJ SOLN: 12.5000 mg | INTRAMUSCULAR | Status: DC | PRN

## 2018-02-04 MED ORDER — EPHEDRINE 5 MG/ML INJ
10.0000 mg | INTRAVENOUS | Status: DC | PRN
  Filled 2018-02-04: qty 2

## 2018-02-04 MED ORDER — OXYTOCIN 10 UNIT/ML IJ SOLN
INTRAMUSCULAR | Status: AC
  Filled 2018-02-04: qty 2

## 2018-02-04 MED ORDER — SODIUM CHLORIDE 0.9 % IV SOLN: 250.0000 mL | INTRAVENOUS | Status: DC | PRN

## 2018-02-04 MED ORDER — SODIUM CHLORIDE 0.9% FLUSH: 3.0000 mL | INTRAVENOUS | Status: DC | PRN

## 2018-02-04 MED ORDER — COCONUT OIL OIL
1.0000 "application " | TOPICAL_OIL | Status: DC | PRN
  Filled 2018-02-04: qty 120

## 2018-02-04 MED ORDER — ONDANSETRON HCL 4 MG/2ML IJ SOLN: 4.0000 mg | Freq: Four times a day (QID) | INTRAMUSCULAR | Status: DC | PRN

## 2018-02-04 MED ORDER — ACETAMINOPHEN 325 MG PO TABS: 650.0000 mg | ORAL_TABLET | ORAL | Status: DC | PRN

## 2018-02-04 MED ORDER — FENTANYL 2.5 MCG/ML W/ROPIVACAINE 0.15% IN NS 100 ML EPIDURAL (ARMC)
EPIDURAL | Status: AC
  Filled 2018-02-04: qty 100

## 2018-02-04 MED ORDER — BENZOCAINE-MENTHOL 20-0.5 % EX AERO
1.0000 "application " | INHALATION_SPRAY | CUTANEOUS | Status: DC | PRN
  Filled 2018-02-04: qty 56

## 2018-02-04 MED ORDER — DIBUCAINE 1 % RE OINT
1.0000 "application " | TOPICAL_OINTMENT | RECTAL | Status: DC | PRN
  Administered 2018-02-04: 1 via RECTAL
  Filled 2018-02-04: qty 28

## 2018-02-04 MED ORDER — SIMETHICONE 80 MG PO CHEW: 80.0000 mg | CHEWABLE_TABLET | ORAL | Status: DC | PRN

## 2018-02-04 MED ORDER — SOD CITRATE-CITRIC ACID 500-334 MG/5ML PO SOLN: 30.0000 mL | ORAL | Status: DC | PRN

## 2018-02-04 MED ORDER — SODIUM CHLORIDE 0.9% FLUSH
3.0000 mL | Freq: Two times a day (BID) | INTRAVENOUS | Status: DC
  Administered 2018-02-05: 3 mL via INTRAVENOUS

## 2018-02-04 MED ORDER — FENTANYL 2.5 MCG/ML W/ROPIVACAINE 0.15% IN NS 100 ML EPIDURAL (ARMC)
12.0000 mL/h | EPIDURAL | Status: DC
  Administered 2018-02-04: 12 mL/h via EPIDURAL

## 2018-02-04 MED ORDER — OXYTOCIN BOLUS FROM INFUSION
500.0000 mL | Freq: Once | INTRAVENOUS | Status: AC
  Administered 2018-02-04: 500 mL via INTRAVENOUS

## 2018-02-04 MED ORDER — LIDOCAINE HCL (PF) 1 % IJ SOLN
INTRAMUSCULAR | Status: AC
  Filled 2018-02-04: qty 30

## 2018-02-04 MED ORDER — ONDANSETRON HCL 4 MG/2ML IJ SOLN: 4.0000 mg | INTRAMUSCULAR | Status: DC | PRN

## 2018-02-04 MED ORDER — LACTATED RINGERS IV SOLN: 500.0000 mL | Freq: Once | INTRAVENOUS | Status: DC

## 2018-02-04 NOTE — OB Triage Note (Signed)
  Patient came in at 0520 from home complaining of contractions that started at 0100. Patient reports contractions to be every 3 minutes apart, lasting around 45 seconds. Rating pain a 5 out of 10. Patient reports less fetal movement since contractions began. Denies vaginal bleeding. Denies leakage of fluids. Upon RN assessment contractions palpate mild. External fetal monitors applied. Tracing contractions every 2-4 minutes apart with fetal tracing in the 130s. Initial blood pressure 124/76. Temperature 98.69F. Will inform provider and continue to assess and monitor. Call bell within reach. Bed in lowest position. Side rails up. Family at bedside.

## 2018-02-04 NOTE — Progress Notes (Signed)
Patient ID: Ariel Ortega, female   DOB: 02/19/1996, 22 y.o.   MRN: [id number]  Ariel Ortega is a 22 y.o. G1P0000 at 5168w3d by ultrasound admitted for active labor  Subjective:  Patient resting quietly in bed on right side, reports pain relief since epidural placement-intermittent pelvic pressure noted.   Denies difficulty breathing or respiratory distress, chest pain, abdominal pain, and leg pain or swelling.   FOB and family members present at beside for continuous labor support.   Objective:  Temp:  [98 F (36.7 C)-98.5 F (36.9 C)] 98.1 F (36.7 C) (09/21 1326) Pulse Rate:  [54-118] 72 (09/21 1326) Resp:  [18-20] 20 (09/21 0849) BP: (96-127)/(45-78) 110/55 (09/21 1326) SpO2:  [98 %-100 %] 100 % (09/21 1326) Weight:  [85.3 kg] 85.3 kg (09/21 0849)  Fetal Wellbeing:  Category I  UC:   regular, every two (2) to three (3) minutes; soft resting tone  SVE:   Dilation: 8.5 Effacement (%): 90 Station: -1 Exam by:: LSE RN   Labs: Lab Results  Component Value Date   WBC 14.0 (H) 02/04/2018   HGB 11.0 (L) 02/04/2018   HCT 31.8 (L) 02/04/2018   MCV 90.7 02/04/2018   PLT 330 02/04/2018    Assessment:  22 year old female G1P0 8168w3d admitted for labor, Rh positive, GBS positive, epidural infusing  FHR Category I  Plan:  Encouraged position change and use of peanut ball.   Reviewed red flag symptoms and when to call.   Continue orders as written. Reassess as needed.    Ariel Ortega, CNM [facility], [facility] 02/04/2018, 2:09 PM

## 2018-02-04 NOTE — H&P (Signed)
Obstetric History and Physical  Miyah Hampshire is a 22 y.o. G1P0000 with IUP at [redacted]w[redacted]d presenting with uterine contractions. Patient states she has been having  regular, every two (2) to three (3) minutes contractions, none vaginal bleeding, intact membranes, with decreased  fetal movement.    Denies difficulty breathing or respiratory distress, chest pain, dysuria, and leg pain or swelling.   FOB at bedside for continuous labor support.   Prenatal Course  Source of Care: [facility]-initial visit at 7 weeks, total visits: 12  Pregnancy complications or risks: History of anxiety and depression, History of drug overdose  Prenatal labs and studies:  ABO, Rh: A/Positive/-- 07/20/2022 1546)  Antibody: Comment, See Final Results 07-20-22 1546)  Rubella: 1.13 07/20/22 1546)  Varicella: 421 20-Jul-2022 1546)  RPR: Non Reactive (07/18 0906)   HBsAg: Negative Jul 20, 2022 1546)   HIV: Non Reactive 07-20-2022 1546)   ZOX:WRUEAVWU (08/28 1605)  1 hr Glucola: 68 (07/18 0906)  Genetic screening: Low risk female (03/11)  Anatomy US: Complete, normal (05/07 0804)  Past Medical History:  Diagnosis Date  . Anxiety   . Depression 04/19/2014  . Mononucleosis 04/09/11    Past Surgical History:  Procedure Laterality Date  . WISDOM TOOTH EXTRACTION      OB History  Gravida Para Term Preterm AB Living  1 0 0 0 0    SAB TAB Ectopic Multiple Live Births  0 0 0        # Outcome Date GA Lbr Len/2nd Weight Sex Delivery Anes PTL Lv  1 Current             Social History   Socioeconomic History  . Marital status: Unknown    Spouse name: Not on file  . Number of children: Not on file  . Years of education: Not on file  . Highest education level: Not on file  Occupational History  . Not on file  Social Needs  . Financial resource strain: Not on file  . Food insecurity:    Worry: Not on file    Inability: Not on file  . Transportation needs:    Medical: Not on file    Non-medical: Not on file  Tobacco  Use  . Smoking status: Never Smoker  . Smokeless tobacco: Never Used  Substance and Sexual Activity  . Alcohol use: No    Alcohol/week: 0.0 standard drinks  . Drug use: No  . Sexual activity: Yes    Birth control/protection: None  Lifestyle  . Physical activity:    Days per week: Not on file    Minutes per session: Not on file  . Stress: Not on file  Relationships  . Social connections:    Talks on phone: Not on file    Gets together: Not on file    Attends religious service: Not on file    Active member of club or organization: Not on file    Attends meetings of clubs or organizations: Not on file    Relationship status: Not on file  Other Topics Concern  . Not on file  Social History Narrative   ** Merged History Encounter **        Family History  Adopted: Yes  Problem Relation Age of Onset  . Cancer - Prostate Father   . Hyperlipidemia Father   . Hypertension Father   . Cancer Father        prostate cancer   . Depression Mother   . Anxiety disorder Mother  Medications Prior to Admission  Medication Sig Dispense Refill Last Dose  . Prenatal Vit-Fe Fumarate-FA (PRENATAL MULTIVITAMIN) TABS tablet Take 1 tablet by mouth daily at 12 noon.   02/03/2018 at Unknown time  . EPINEPHrine (EPIPEN) 0.3 mg/0.3 mL DEVI Inject 0.3 mLs (0.3 mg total) into the muscle once. 2 Device 1 Taking    Allergies  Allergen Reactions  . Effexor [Venlafaxine] Nausea And Vomiting  . Milk-Related Compounds Nausea And Vomiting    Lactose intolerance    Review of Systems: Negative except for what is mentioned in HPI.  Physical Exam:  Temp:  [98 F (36.7 C)] 98 F (36.7 C) (09/21 0524) Pulse Rate:  [104] 104 (09/21 0524) Resp:  [18] 18 (09/21 0524) BP: (124)/(76) 124/76 (09/21 0524)  GENERAL: Well-developed, well-nourished female in no acute distress.   LUNGS: Clear to auscultation bilaterally.   HEART: Regular rate and rhythm.  ABDOMEN: Soft, nontender, nondistended,  gravid.  EXTREMITIES: Nontender, no edema, 2+ distal pulses.  Cervical Exam: Dilation: 5.5 Effacement (%): 80 Cervical Position: Middle Station: -1 Exam by:: MLawhorn CNM   FHT:  Baseline rate 130 bpm   Variability moderate  Accelerations present   Decelerations none  Contractions: Every two (2) to four (4) minutes; soft resting tone   Pertinent Labs/Studies:    No results found for this or any previous visit (from the past 24 hour(s)).  Assessment :  Arnell AsalMichelle Guia is a 22 y.o. G1P0000 at 9870w3d being admitted for labor, Rh positive, GBS positive, desires epidural, plans breastfeeding  FHR Category I  Plan:  Admit to birthing suites, see orders.   IV antibiotics for GBS prophylaxis, see orders.   Labor: Expectant management.  Induction/Augmentation as needed, per protocol.  Delivery plan: Hopeful for vaginal delivery.   Dr. Valentino Saxonherry notified of admission and plan of care.    Gunnar BullaJenkins Taleigh Beverlyann Broxterman, CNM [facility], [facility] 02/04/18 8:25 AM

## 2018-02-04 NOTE — Anesthesia Preprocedure Evaluation (Addendum)
Anesthesia Evaluation  Patient identified by MRN, date of birth, ID band Patient awake    Reviewed: Allergy & Precautions, H&P , NPO status , Patient's Chart, lab work & pertinent test results  Airway Mallampati: II  TM Distance: >3 FB Neck ROM: full    Dental   Pulmonary neg pulmonary ROS,           Cardiovascular Exercise Tolerance: Good (-) hypertensionnegative cardio ROS       Neuro/Psych    GI/Hepatic negative GI ROS,   Endo/Other    Renal/GU   negative genitourinary   Musculoskeletal  (+) Arthritis ,   Abdominal   Peds  Hematology negative hematology ROS (+)   Anesthesia Other Findings Past Medical History: No date: Anxiety 04/19/2014: Depression 04/09/11: Mononucleosis  Past Surgical History: No date: WISDOM TOOTH EXTRACTION  BMI    Body Mass Index:  34.39 kg/m      Reproductive/Obstetrics (+) Pregnancy                            Anesthesia Physical Anesthesia Plan  ASA: II  Anesthesia Plan: Epidural   Post-op Pain Management:    Induction:   PONV Risk Score and Plan:   Airway Management Planned:   Additional Equipment:   Intra-op Plan:   Post-operative Plan:   Informed Consent: I have reviewed the patients History and Physical, chart, labs and discussed the procedure including the risks, benefits and alternatives for the proposed anesthesia with the patient or authorized representative who has indicated his/her understanding and acceptance.     Plan Discussed with: Anesthesiologist  Anesthesia Plan Comments:         Anesthesia Quick Evaluation

## 2018-02-04 NOTE — Progress Notes (Signed)
Verbal order for intermittent monitoring.

## 2018-02-04 NOTE — Anesthesia Procedure Notes (Signed)
Epidural Patient location during procedure: OB Start time: 02/04/2018 11:10 AM End time: 02/04/2018 11:25 AM  Staffing Anesthesiologist: Jovita GammaFitzgerald, Dandra Velardi L, MD Performed: anesthesiologist   Preanesthetic Checklist Completed: patient identified, site marked, surgical consent, pre-op evaluation, timeout performed, IV checked, risks and benefits discussed and monitors and equipment checked  Epidural Patient position: sitting Prep: ChloraPrep Patient monitoring: heart rate, continuous pulse ox and blood pressure Approach: midline Location: L4-L5 Injection technique: LOR saline  Needle:  Needle type: Tuohy  Needle gauge: 18 G Needle length: 9 cm and 9 Needle insertion depth: 6 cm Catheter type: closed end flexible Catheter size: 20 Guage Catheter at skin depth: 10 cm Test dose: negative and Other  Assessment Events: blood not aspirated, injection not painful, no injection resistance, negative IV test and no paresthesia  Additional Notes   Patient tolerated the insertion well without complications.Reason for block:procedure for pain

## 2018-02-05 LAB — CBC
HCT: 27.7 % — ABNORMAL LOW (ref 35.0–47.0)
HEMOGLOBIN: 9.6 g/dL — AB (ref 12.0–16.0)
MCH: 31.7 pg (ref 26.0–34.0)
MCHC: 34.8 g/dL (ref 32.0–36.0)
MCV: 90.9 fL (ref 80.0–100.0)
Platelets: 258 10*3/uL (ref 150–440)
RBC: 3.04 MIL/uL — AB (ref 3.80–5.20)
RDW: 14 % (ref 11.5–14.5)
WBC: 13.9 10*3/uL — ABNORMAL HIGH (ref 3.6–11.0)

## 2018-02-05 MED ORDER — CYCLOBENZAPRINE HCL 10 MG PO TABS
10.0000 mg | ORAL_TABLET | Freq: Three times a day (TID) | ORAL | Status: DC | PRN
  Administered 2018-02-05 – 2018-02-06 (×3): 10 mg via ORAL
  Filled 2018-02-05 (×4): qty 1

## 2018-02-05 MED ORDER — MAGNESIUM OXIDE 400 (241.3 MG) MG PO TABS
400.0000 mg | ORAL_TABLET | Freq: Every day | ORAL | Status: DC
  Administered 2018-02-05 – 2018-02-06 (×2): 400 mg via ORAL
  Filled 2018-02-05 (×2): qty 1

## 2018-02-05 MED ORDER — POLYSACCHARIDE IRON COMPLEX 150 MG PO CAPS
150.0000 mg | ORAL_CAPSULE | Freq: Every day | ORAL | Status: DC
  Administered 2018-02-05 – 2018-02-06 (×2): 150 mg via ORAL
  Filled 2018-02-05 (×2): qty 1

## 2018-02-05 MED ORDER — CYCLOBENZAPRINE HCL 10 MG PO TABS
5.0000 mg | ORAL_TABLET | Freq: Three times a day (TID) | ORAL | Status: DC | PRN
  Filled 2018-02-05: qty 0.5

## 2018-02-05 NOTE — Progress Notes (Signed)
Patient ID: Ariel Ortega, female   DOB: 06/02/1995, 22 y.o.   MRN: [id number]  Post Partum Day # 1, s/p spontaneous vaginal birth with repaired second degree and right labial lacerations, Rh positive, postpartum anemia due to blood loss  Subjective:  Patient sitting in bed, reports left breast pain. Infant sleeping in bassinet at bedside. FOB asleep in chair.   Denies difficulty breathing or respiratory distress, chest pain, abdominal pain, excessive vaginal bleeding, dysuria, and leg pain or swelling.   Objective:  Temp:  [98.1 F (36.7 C)-98.6 F (37 C)] 98.2 F (36.8 C) (09/22 0722) Pulse Rate:  [54-118] 91 (09/22 0722) Resp:  [18-20] 18 (09/22 0722) BP: (96-134)/(45-78) 113/68 (09/22 0722) SpO2:  [98 %-100 %] 99 % (09/22 0722) Weight:  [85.3 kg] 85.3 kg (09/21 0849)  Physical Exam:   General: alert and cooperative   Lungs: clear to auscultation bilaterally  Breasts: bilateral redness and cracking present; left>right  Heart: normal apical impulse  Abdomen: soft, non-tender; bowel sounds normal; no masses,  no organomegaly  Pelvis: Lochia: appropriate, Uterine Fundus: firm  Extremities: DVT Evaluation: no evidence of DVT seen on physical exam.  Recent Labs    02/04/18 0849 02/05/18 0615  HGB 11.0* 9.6*  HCT 31.8* 27.7*    Assessment:  22 year old female G1P1, Post Partum Day # 1, s/p spontaneous vaginal birth with repaired second degree and right labial lacerations, Rh positive, postpartum anemia due to blood loss  Breastfeeding  Plan:  Oral iron and magnesium supplementation, see orders.   Routine postpartum care and education.   Reviewed red flag symptoms and when to call.   Continue orders as written. Reassess as needed.   Anticipate discharge tomorrow.    LOS: 1 day    Gunnar BullaJenkins Christon Lawhorn, CNM [facility], [facility] 02/05/2018 8:28 AM

## 2018-02-05 NOTE — Anesthesia Postprocedure Evaluation (Signed)
Anesthesia Post Note  Patient: Ariel Ortega  Procedure(s) Performed: AN AD HOC LABOR EPIDURAL  Patient location during evaluation: Mother Baby Anesthesia Type: Epidural Level of consciousness: awake and alert Pain management: pain level controlled Vital Signs Assessment: post-procedure vital signs reviewed and stable Respiratory status: spontaneous breathing, nonlabored ventilation and respiratory function stable Cardiovascular status: stable Postop Assessment: no headache, no backache and epidural receding Anesthetic complications: no     Last Vitals:  Vitals:   02/05/18 1611 02/05/18 2000  BP: 127/78 123/69  Pulse: 92 91  Resp: 20 18  Temp: 36.9 C 37.3 C  SpO2:  99%    Last Pain:  Vitals:   02/05/18 1937  TempSrc:   PainSc: 3                  Lenard SimmerAndrew Finnley [place]

## 2018-02-06 LAB — RPR: RPR Ser Ql: NONREACTIVE

## 2018-02-06 MED ORDER — POLYSACCHARIDE IRON COMPLEX 150 MG PO CAPS: 150.0000 mg | ORAL_CAPSULE | Freq: Every day | ORAL | 1 refills | Status: DC

## 2018-02-06 MED ORDER — IBUPROFEN 600 MG PO TABS: 600.0000 mg | ORAL_TABLET | Freq: Four times a day (QID) | ORAL | 0 refills | Status: DC

## 2018-02-06 MED ORDER — MAGNESIUM OXIDE 400 (241.3 MG) MG PO TABS: 400.0000 mg | ORAL_TABLET | Freq: Every day | ORAL | 1 refills | Status: DC

## 2018-02-06 MED ORDER — ACETAMINOPHEN 325 MG PO TABS: 650.0000 mg | ORAL_TABLET | ORAL | 0 refills | Status: DC | PRN

## 2018-02-06 NOTE — Progress Notes (Signed)
Provided and reviewed discharge paperwork. Verified understanding by use of teach back method. Pt verbalized understanding as well. Pt to make her own follow up appointment. To be discharged with infant to go home.

## 2018-02-06 NOTE — Discharge Summary (Signed)
Obstetric Discharge Summary  Patient ID: Ariel Ortega MRN: [id number] DOB/AGE: 1995/07/28 22 y.o.   Date of Admission: 02/04/2018  Date of Discharge:  02/06/18   Admitting Diagnosis: Onset of Labor at [redacted]w[redacted]d  Secondary Diagnosis: Postpartum anemia  Mode of Delivery: normal spontaneous vaginal delivery     Discharge Diagnosis: No other diagnosis   Intrapartum Procedures: GBS prophylaxis   Post partum procedures: None  Complications: Second degree perineal laceration and right labial laceration, repaired   Brief Hospital Course   Ariel Ortega is a G1P1001 who had a SVD on 02/04/2018;  for further details of this birth, please refer to the delivey note.  Patient had an uncomplicated postpartum course.  By time of discharge on PPD#2, her pain was controlled on oral pain medications; she had appropriate lochia and was ambulating, voiding without difficulty and tolerating regular diet.  She was deemed stable for discharge to home.    Labs: CBC Latest Ref Rng & Units 02/05/2018 02/04/2018 12/01/2017  WBC 3.6 - 11.0 K/uL 13.9(H) 14.0(H) 10.1  Hemoglobin 12.0 - 16.0 g/dL 4.0(J) 11.0(L) 11.1  Hematocrit 35.0 - 47.0 % 27.7(L) 31.8(L) 33.5(L)  Platelets 150 - 440 K/uL 258 330 305   A POS  Physical exam:   Temp:  [98 F (36.7 C)-99.1 F (37.3 C)] 98 F (36.7 C) (09/23 0751) Pulse Rate:  [89-92] 90 (09/23 0751) Resp:  [18-20] 18 (09/23 0751) BP: (105-127)/(60-78) 108/60 (09/23 0751) SpO2:  [98 %-100 %] 100 % (09/23 0751)  General: alert and no distress  Lochia: appropriate  Abdomen: soft, NT  Uterine Fundus: firm  Extremities: no evidence of DVT seen on physical exam  Discharge Instructions: Per After Visit Summary.  Activity: Advance as tolerated. Pelvic rest for 6 weeks.  Also refer to After Visit Summary  Diet: Regular  Medications: Allergies as of 02/06/2018      Reactions   Effexor [venlafaxine] Nausea And Vomiting   Milk-related Compounds Nausea And Vomiting    Lactose intolerance      Medication List    TAKE these medications   acetaminophen 325 MG tablet Commonly known as:  TYLENOL Take 2 tablets (650 mg total) by mouth every 4 (four) hours as needed (for pain scale < 4).   EPINEPHrine 0.3 mg/0.3 mL Soaj injection Commonly known as:  EPI-PEN Inject 0.3 mLs (0.3 mg total) into the muscle once.   ibuprofen 600 MG tablet Commonly known as:  ADVIL,MOTRIN Take 1 tablet (600 mg total) by mouth every 6 (six) hours.   iron polysaccharides 150 MG capsule Commonly known as:  NIFEREX Take 1 capsule (150 mg total) by mouth daily. Start taking on:  02/07/2018   magnesium oxide 400 (241.3 Mg) MG tablet Commonly known as:  MAG-OX Take 1 tablet (400 mg total) by mouth daily. Start taking on:  02/07/2018   prenatal multivitamin Tabs tablet Take 1 tablet by mouth daily at 12 noon.      Outpatient follow up:  Follow-up Information    Flonnie, Wierman, CNM. Call.   Specialties:  Certified Nurse Midwife, Obstetrics and Gynecology, Radiology Why:  Please call to schedule six (6) week PPV with JML Contact information: [address] [place] [address] [phone]          Postpartum contraception: Depo-Provera  Discharged Condition: stable  Discharged to: home   Newborn Data:  Disposition:home with mother  Apgars: APGAR (1 MIN): 8   APGAR (5 MINS): 9    Baby Feeding: Breast  Gunnar BullaJenkins Nastasha Izaias Krupka, CNM [facility], [facility] 02/06/18 1:20 PM

## 2018-02-06 NOTE — Lactation Note (Signed)
This note was copied from a baby's chart. Lactation Consultation Note  Patient Name: Ariel Ortega Reason for consult: Mother's request   Maternal Data Formula Feeding for Exclusion: No Has patient been taught Hand Expression?: Yes Does the patient have breastfeeding experience prior to this delivery?: No  Feeding Feeding Type: Breast Fed Length of feed: 15 min  LATCH Score Latch: Grasps breast easily, tongue down, lips flanged, rhythmical sucking.  Audible Swallowing: A few with stimulation  Type of Nipple: Everted at rest and after stimulation  Comfort (Breast/Nipple): Filling, red/small blisters or bruises, mild/mod discomfort  Hold (Positioning): Assistance needed to correctly position infant at breast and maintain latch.  LATCH Score: 7  Interventions    Lactation Tools Discussed/Used     Consult Status Consult Status: Follow-up  Mom has cracks on her left breast. Baby folds bottom lip under when at the breast. Parents had latched baby onto mom's right breast when LC arrived and LC did not take baby off. LC pulled on baby's chin to unfold bottom lip and instructed parents to make sure both lips are flanged out on mom's areola during feedings. Discussed that the left side still needs to be stimulated and that mom can hand express colostrum on it for healing. Talked about feeding cues and resources for breastfeeding support after discharge.   Ariel Ortega Ortega, 10:52 AM

## 2018-02-06 NOTE — Lactation Note (Signed)
This note was copied from a baby's chart. Lactation Consultation Note  Patient Name: Ariel Ortega ZOXWR'UToday's Date: 02/06/2018 Reason for consult: Follow-up assessment;Primapara;Nipple pain/trauma Mom states her nipples are tender when not pregnant, now they are extremely painful when nursing, left nipple scabbed and cracked, right has stripe across nipple  Maternal Data Formula Feeding for Exclusion: Yes Reason for exclusion: (very sore nipples, baby has not voided since 1200am) Has patient been taught Hand Expression?: Yes Does the patient have breastfeeding experience prior to this delivery?: No Mom states she has pain all through feeding at breast despite position change and flanging of baby's lips, her facial expressions indicate she is in pain, 20 mm nipple shield applied to breast with instruction in use and mom states this is slightly better, but still painful throughout the feeding.   Feeding Feeding Type: Bottle Fed - Formula Nipple Type: Slow - flow Length of feed: 40 min BAby has not voided since 0000 am, mom pumped breasts x 15 and obtained few drops after nursing, mom desired to supplement baby with formula at this feeding  LATCH Score Latch: Grasps breast easily, tongue down, lips flanged, rhythmical sucking.  Audible Swallowing: A few with stimulation  Type of Nipple: Everted at rest and after stimulation  Comfort (Breast/Nipple): Engorged, cracked, bleeding, large blisters, severe discomfort  Hold (Positioning): Assistance needed to correctly position infant at breast and maintain latch.  LATCH Score: 6  Interventions Interventions: Assisted with latch;Hand express;Adjust position;Support pillows;Position options;Coconut oil;Comfort gels;DEBP with 27 mm flanges and use coconut oil in flange mom desires to pump breasts and supplement with formula for a few feedings to give nipples a chance to heal, she will pump with a Medela swing pump at home and may decide to  rent a symphony breast pump from lactation.  Info given on formula preparation Lactation Tools Discussed/Used Tools: Nipple Shields Nipple shield size: 20 WIC Program: Yes Pump Review: Setup, frequency, and cleaning;Milk Storage Initiated by:: Cay SchillingsM Ursala Cressy RNC IBCLC Date initiated:: 02/06/18   Consult Status Consult Status: PRN    Dyann KiefMarsha D Khylee Algeo 02/06/2018, 5:13 PM

## 2018-02-08 NOTE — Progress Notes (Signed)
ROB-Doing well, using herbal prep. Request SVE, unchanged since previous visit. Discussed postdates care. Reviewed red flag symptoms and when to call. RTC x 1 week for Growth US/BPP and ROB or sooner if needed.

## 2018-02-10 ENCOUNTER — Other Ambulatory Visit: Payer: Medicaid Other

## 2018-02-10 ENCOUNTER — Encounter: Payer: Medicaid Other | Admitting: Certified Nurse Midwife

## 2018-04-03 ENCOUNTER — Encounter: Payer: Self-pay | Admitting: Certified Nurse Midwife

## 2018-04-03 ENCOUNTER — Ambulatory Visit (INDEPENDENT_AMBULATORY_CARE_PROVIDER_SITE_OTHER): Payer: Medicaid Other | Admitting: Certified Nurse Midwife

## 2018-04-03 DIAGNOSIS — O9081 Anemia of the puerperium: Secondary | ICD-10-CM

## 2018-04-03 DIAGNOSIS — Z30430 Encounter for insertion of intrauterine contraceptive device: Secondary | ICD-10-CM

## 2018-04-03 NOTE — Progress Notes (Signed)
Patient here for 6 week PPV.  No complaints.   

## 2018-04-03 NOTE — Progress Notes (Signed)
Subjective:    Ariel Ortega is a 22 y.o. [id number] Caucasian female who presents for a postpartum visit. She is 8 weeks postpartum following a spontaneous vaginal birth at 39+3 gestational weeks. Anesthesia: epidural. I have fully reviewed the prenatal and intrapartum course.   Postpartum course has been uncomplicated. Baby's course has been uncomplicated. Baby is feeding by bottle - GoodStart-GentleEase. Normal menses has resumed. Bowel function is normal. Bladder function is normal.   Patient is not sexually active. Desires IUD as contraception. Postpartum depression screening: negative. Score 0.  Pap is due next year.   Denies difficulty breathing or respiratory distress, chest pain, abdominal pain, excessive vaginal bleeding, dysuria, and leg pain or swelling.   The following portions of the patient's history were reviewed and updated as appropriate: allergies, current medications, past medical history, past surgical history and problem list.  Review of Systems  Pertinent items are noted in HPI.   Objective:   BP (!) 104/59   Pulse 74   Ht 5\' 2"  (1.575 m)   Wt 161 lb 8 oz (73.3 kg)   LMP 04/03/2018 (Exact Date)   Breastfeeding? No   BMI 29.54 kg/m   General:  alert, cooperative and no distress   Breasts:  deferred, no complaints  Lungs: clear to auscultation bilaterally  Heart:  regular rate and rhythm  Abdomen: soft, nontender   Vulva: normal  Vagina: normal vagina  Cervix:  closed  Corpus: Well-involuted  Adnexa:  Non-palpable         Office Visit from 04/03/2018 in [facility]  PHQ-2 Total Score  0      Assessment:   Postpartum exam Eight (8) wks s/p spontaneous vaginal birth Formula feeding Depression screening Contraception counseling   Plan:   Desires Mirena IUD, see note below.   Encouraged health maintenance techniques.   Reviewed red flag symptoms and when to call.   Follow up in: 10 months for ANNUAL EXAM or earlier if  needed.   Gunnar BullaJenkins [name], CNM [facility], [facility] 04/03/18 9:56 AM   Mirena Insertion Note:  Ariel Ortega is a 22 y.o. year old [id number] Caucasian female who presents for placement of a Mirena IUD.  BP (!) 104/59   Pulse 74   Ht 5\' 2"  (1.575 m)   Wt 161 lb 8 oz (73.3 kg)   LMP 04/03/2018 (Exact Date)   Breastfeeding? No   BMI 29.54 kg/m   The risks and benefits of the method and placement have been thouroughly reviewed with the patient and all questions were answered.  Specifically the patient is aware of failure rate of 05/998, expulsion of the IUD and of possible perforation.  The patient is aware of irregular bleeding due to the method and understands the incidence of irregular bleeding diminishes with time.  Signed copy of informed consent in chart.   Time out was performed.  A small plastic speculum was placed in the vagina.  The cervix was visualized, prepped using Betadine, and grasped with a single tooth tenaculum. The uterus was sounded to 7 cm. The strings were trimmed to 3 cm.  The patient was given post procedure instructions, including signs and symptoms of infection and to check for the strings after each menses or each month, and refraining from intercourse or anything in the vagina for 3 days.  She was given a Mirena care card with date Mirena placed, and date Mirena to be removed.  Reviewed red flag symptoms and when to call.  RTC x 4-6 weeks for IUD string check or sooner if needed.    Gunnar Bulla, CNM [facility], [facility] 04/03/18 9:59 AM  NDC: 16109-604-54 Lot: [id number] Exp: 05/2020

## 2018-04-03 NOTE — Patient Instructions (Addendum)
IUD PLACEMENT POST-PROCEDURE INSTRUCTIONS  1. You may take Ibuprofen, Aleve or Tylenol for pain if needed.  Cramping should resolve within in 24 hours.  2. You may have a small amount of spotting.  You should wear a mini pad for the next few days.  3. You may have intercourse after 24 hours.  If you using this for birth control, it is effective immediately.  4. You need to call if you have any pelvic pain, fever, heavy bleeding or foul smelling vaginal discharge.  Irregular bleeding is common the first several months after having an IUD placed. You do not need to call for this reason unless you are concerned.  5. Shower or bathe as normal  6. You should have a follow-up appointment in 4-8 weeks for a re-check to make sure you are not having any problems.  Levonorgestrel intrauterine device (IUD) What is this medicine? LEVONORGESTREL IUD (LEE voe nor jes trel) is a contraceptive (birth control) device. The device is placed inside the uterus by a healthcare professional. It is used to prevent pregnancy. This device can also be used to treat heavy bleeding that occurs during your period. This medicine may be used for other purposes; ask your health care provider or pharmacist if you have questions. COMMON BRAND NAME(S): Minette Headland What should I tell my health care provider before I take this medicine? They need to know if you have any of these conditions: -abnormal Pap smear -cancer of the breast, uterus, or cervix -diabetes -endometritis -genital or pelvic infection now or in the past -have more than one sexual partner or your partner has more than one partner -heart disease -history of an ectopic or tubal pregnancy -immune system problems -IUD in place -liver disease or tumor -problems with blood clots or take blood-thinners -seizures -use intravenous drugs -uterus of unusual shape -vaginal bleeding that has not been explained -an unusual or allergic reaction  to levonorgestrel, other hormones, silicone, or polyethylene, medicines, foods, dyes, or preservatives -pregnant or trying to get pregnant -breast-feeding How should I use this medicine? This device is placed inside the uterus by a health care professional. Talk to your pediatrician regarding the use of this medicine in children. Special care may be needed. Overdosage: If you think you have taken too much of this medicine contact a poison control center or emergency room at once. NOTE: This medicine is only for you. Do not share this medicine with others. What if I miss a dose? This does not apply. Depending on the brand of device you have inserted, the device will need to be replaced every 3 to 5 years if you wish to continue using this type of birth control. What may interact with this medicine? Do not take this medicine with any of the following medications: -amprenavir -bosentan -fosamprenavir This medicine may also interact with the following medications: -aprepitant -armodafinil -barbiturate medicines for inducing sleep or treating seizures -bexarotene -boceprevir -griseofulvin -medicines to treat seizures like carbamazepine, ethotoin, felbamate, oxcarbazepine, phenytoin, topiramate -modafinil -pioglitazone -rifabutin -rifampin -rifapentine -some medicines to treat HIV infection like atazanavir, efavirenz, indinavir, lopinavir, nelfinavir, tipranavir, ritonavir -St. John's wort -warfarin This list may not describe all possible interactions. Give your health care provider a list of all the medicines, herbs, non-prescription drugs, or dietary supplements you use. Also tell them if you smoke, drink alcohol, or use illegal drugs. Some items may interact with your medicine. What should I watch for while using this medicine? Visit your doctor or health care  professional for regular check ups. See your doctor if you or your partner has sexual contact with others, becomes HIV positive,  or gets a sexual transmitted disease. This product does not protect you against HIV infection (AIDS) or other sexually transmitted diseases. You can check the placement of the IUD yourself by reaching up to the top of your vagina with clean fingers to feel the threads. Do not pull on the threads. It is a good habit to check placement after each menstrual period. Call your doctor right away if you feel more of the IUD than just the threads or if you cannot feel the threads at all. The IUD may come out by itself. You may become pregnant if the device comes out. If you notice that the IUD has come out use a backup birth control method like condoms and call your health care provider. Using tampons will not change the position of the IUD and are okay to use during your period. This IUD can be safely scanned with magnetic resonance imaging (MRI) only under specific conditions. Before you have an MRI, tell your healthcare provider that you have an IUD in place, and which type of IUD you have in place. What side effects may I notice from receiving this medicine? Side effects that you should report to your doctor or health care professional as soon as possible: -allergic reactions like skin rash, itching or hives, swelling of the face, lips, or tongue -fever, flu-like symptoms -genital sores -high blood pressure -no menstrual period for 6 weeks during use -pain, swelling, warmth in the leg -pelvic pain or tenderness -severe or sudden headache -signs of pregnancy -stomach cramping -sudden shortness of breath -trouble with balance, talking, or walking -unusual vaginal bleeding, discharge -yellowing of the eyes or skin Side effects that usually do not require medical attention (report to your doctor or health care professional if they continue or are bothersome): -acne -breast pain -change in sex drive or performance -changes in weight -cramping, dizziness, or faintness while the device is being  inserted -headache -irregular menstrual bleeding within first 3 to 6 months of use -nausea This list may not describe all possible side effects. Call your doctor for medical advice about side effects. You may report side effects to FDA at 1-800-FDA-1088. Where should I keep my medicine? This does not apply. NOTE: This sheet is a summary. It may not cover all possible information. If you have questions about this medicine, talk to your doctor, pharmacist, or health care provider.  2018 Elsevier/Gold Standard (2016-02-13 14:14:56)   Preventive Care 18-39 Years, Female Preventive care refers to lifestyle choices and visits with your health care provider that can promote health and wellness. What does preventive care include?  A yearly physical exam. This is also called an annual well check.  Dental exams once or twice a year.  Routine eye exams. Ask your health care provider how often you should have your eyes checked.  Personal lifestyle choices, including: ? Daily care of your teeth and gums. ? Regular physical activity. ? Eating a healthy diet. ? Avoiding tobacco and drug use. ? Limiting alcohol use. ? Practicing safe sex. ? Taking vitamin and mineral supplements as recommended by your health care provider. What happens during an annual well check? The services and screenings done by your health care provider during your annual well check will depend on your age, overall health, lifestyle risk factors, and family history of disease. Counseling Your health care provider may ask you questions  about your:  Alcohol use.  Tobacco use.  Drug use.  Emotional well-being.  Home and relationship well-being.  Sexual activity.  Eating habits.  Work and work Statistician.  Method of birth control.  Menstrual cycle.  Pregnancy history.  Screening You may have the following tests or measurements:  Height, weight, and BMI.  Diabetes screening. This is done by checking  your blood sugar (glucose) after you have not eaten for a while (fasting).  Blood pressure.  Lipid and cholesterol levels. These may be checked every 5 years starting at age 60.  Skin check.  Hepatitis C blood test.  Hepatitis B blood test.  Sexually transmitted disease (STD) testing.  BRCA-related cancer screening. This may be done if you have a family history of breast, ovarian, tubal, or peritoneal cancers.  Pelvic exam and Pap test. This may be done every 3 years starting at age 38. Starting at age 60, this may be done every 5 years if you have a Pap test in combination with an HPV test.  Discuss your test results, treatment options, and if necessary, the need for more tests with your health care provider. Vaccines Your health care provider may recommend certain vaccines, such as:  Influenza vaccine. This is recommended every year.  Tetanus, diphtheria, and acellular pertussis (Tdap, Td) vaccine. You may need a Td booster every 10 years.  Varicella vaccine. You may need this if you have not been vaccinated.  HPV vaccine. If you are 32 or younger, you may need three doses over 6 months.  Measles, mumps, and rubella (MMR) vaccine. You may need at least one dose of MMR. You may also need a second dose.  Pneumococcal 13-valent conjugate (PCV13) vaccine. You may need this if you have certain conditions and were not previously vaccinated.  Pneumococcal polysaccharide (PPSV23) vaccine. You may need one or two doses if you smoke cigarettes or if you have certain conditions.  Meningococcal vaccine. One dose is recommended if you are age 18-21 years and a first-year college student living in a residence hall, or if you have one of several medical conditions. You may also need additional booster doses.  Hepatitis A vaccine. You may need this if you have certain conditions or if you travel or work in places where you may be exposed to hepatitis A.  Hepatitis B vaccine. You may need  this if you have certain conditions or if you travel or work in places where you may be exposed to hepatitis B.  Haemophilus influenzae type b (Hib) vaccine. You may need this if you have certain risk factors.  Talk to your health care provider about which screenings and vaccines you need and how often you need them. This information is not intended to replace advice given to you by your health care provider. Make sure you discuss any questions you have with your health care provider. Document Released: 06/29/2001 Document Revised: 01/21/2016 Document Reviewed: 03/04/2015 Elsevier Interactive Patient Education  [employer].

## 2018-05-08 ENCOUNTER — Encounter: Payer: Medicaid Other | Admitting: Certified Nurse Midwife

## 2018-05-18 ENCOUNTER — Encounter: Payer: Medicaid Other | Admitting: Certified Nurse Midwife

## 2018-06-06 ENCOUNTER — Ambulatory Visit (INDEPENDENT_AMBULATORY_CARE_PROVIDER_SITE_OTHER): Payer: BLUE CROSS/BLUE SHIELD | Admitting: Certified Nurse Midwife

## 2018-06-06 ENCOUNTER — Encounter: Payer: Self-pay | Admitting: Certified Nurse Midwife

## 2018-06-06 VITALS — BP 96/56 | HR 62 | Ht 62.0 in | Wt 147.1 lb

## 2018-06-06 DIAGNOSIS — Z975 Presence of (intrauterine) contraceptive device: Secondary | ICD-10-CM | POA: Diagnosis not present

## 2018-06-06 DIAGNOSIS — Z30431 Encounter for routine checking of intrauterine contraceptive device: Secondary | ICD-10-CM

## 2018-06-06 NOTE — Progress Notes (Signed)
Patient here for follow-up after Mirena IUD insertion, no complaints.

## 2018-06-06 NOTE — Patient Instructions (Addendum)
WE WOULD LOVE TO HEAR FROM YOU!!!!   Thank you Ariel Ortega for visiting [facility].  Providing our patients with the best experience possible is really important to [place], and we hope that you felt that on your recent visit. The most valuable feedback we get comes from [place]!!    If you receive a survey please take a couple of minutes to let us know how we did.Thank you for continuing to trust [place] with your care.   [facility]   Levonorgestrel intrauterine device (IUD) What is this medicine? LEVONORGESTREL IUD (LEE voe nor jes trel) is a contraceptive (birth control) device. The device is placed inside the uterus by a healthcare professional. It is used to prevent pregnancy. This device can also be used to treat heavy bleeding that occurs during your period. This medicine may be used for other purposes; ask your health care provider or pharmacist if you have questions. COMMON BRAND NAME(S): Minette Headland What should I tell my health care provider before I take this medicine? They need to know if you have any of these conditions: -abnormal Pap smear -cancer of the breast, uterus, or cervix -diabetes -endometritis -genital or pelvic infection now or in the past -have more than one sexual partner or your partner has more than one partner -heart disease -history of an ectopic or tubal pregnancy -immune system problems -IUD in place -liver disease or tumor -problems with blood clots or take blood-thinners -seizures -use intravenous drugs -uterus of unusual shape -vaginal bleeding that has not been explained -an unusual or allergic reaction to levonorgestrel, other hormones, silicone, or polyethylene, medicines, foods, dyes, or preservatives -pregnant or trying to get pregnant -breast-feeding How should I use this medicine? This device is placed inside the uterus by a health care professional. Talk to your pediatrician regarding the use of  this medicine in children. Special care may be needed. Overdosage: If you think you have taken too much of this medicine contact a poison control center or emergency room at once. NOTE: This medicine is only for you. Do not share this medicine with others. What if I miss a dose? This does not apply. Depending on the brand of device you have inserted, the device will need to be replaced every 3 to 5 years if you wish to continue using this type of birth control. What may interact with this medicine? Do not take this medicine with any of the following medications: -amprenavir -bosentan -fosamprenavir This medicine may also interact with the following medications: -aprepitant -armodafinil -barbiturate medicines for inducing sleep or treating seizures -bexarotene -boceprevir -griseofulvin -medicines to treat seizures like carbamazepine, ethotoin, felbamate, oxcarbazepine, phenytoin, topiramate -modafinil -pioglitazone -rifabutin -rifampin -rifapentine -some medicines to treat HIV infection like atazanavir, efavirenz, indinavir, lopinavir, nelfinavir, tipranavir, ritonavir -St. John's wort -warfarin This list may not describe all possible interactions. Give your health care provider a list of all the medicines, herbs, non-prescription drugs, or dietary supplements you use. Also tell them if you smoke, drink alcohol, or use illegal drugs. Some items may interact with your medicine. What should I watch for while using this medicine? Visit your doctor or health care professional for regular check ups. See your doctor if you or your partner has sexual contact with others, becomes HIV positive, or gets a sexual transmitted disease. This product does not protect you against HIV infection (AIDS) or other sexually transmitted diseases. You can check the placement of the IUD yourself by reaching up  to the top of your vagina with clean fingers to feel the threads. Do not pull on the threads. It is a  good habit to check placement after each menstrual period. Call your doctor right away if you feel more of the IUD than just the threads or if you cannot feel the threads at all. The IUD may come out by itself. You may become pregnant if the device comes out. If you notice that the IUD has come out use a backup birth control method like condoms and call your health care provider. Using tampons will not change the position of the IUD and are okay to use during your period. This IUD can be safely scanned with magnetic resonance imaging (MRI) only under specific conditions. Before you have an MRI, tell your healthcare provider that you have an IUD in place, and which type of IUD you have in place. What side effects may I notice from receiving this medicine? Side effects that you should report to your doctor or health care professional as soon as possible: -allergic reactions like skin rash, itching or hives, swelling of the face, lips, or tongue -fever, flu-like symptoms -genital sores -high blood pressure -no menstrual period for 6 weeks during use -pain, swelling, warmth in the leg -pelvic pain or tenderness -severe or sudden headache -signs of pregnancy -stomach cramping -sudden shortness of breath -trouble with balance, talking, or walking -unusual vaginal bleeding, discharge -yellowing of the eyes or skin Side effects that usually do not require medical attention (report to your doctor or health care professional if they continue or are bothersome): -acne -breast pain -change in sex drive or performance -changes in weight -cramping, dizziness, or faintness while the device is being inserted -headache -irregular menstrual bleeding within first 3 to 6 months of use -nausea This list may not describe all possible side effects. Call your doctor for medical advice about side effects. You may report side effects to FDA at 1-800-FDA-1088. Where should I keep my medicine? This does not  apply. NOTE: This sheet is a summary. It may not cover all possible information. If you have questions about this medicine, talk to your doctor, pharmacist, or health care provider.  2019 Elsevier/Gold Standard (2016-02-13 14:14:56) Preventive Care 18-39 Years, Female Preventive care refers to lifestyle choices and visits with your health care provider that can promote health and wellness. What does preventive care include?   A yearly physical exam. This is also called an annual well check.  Dental exams once or twice a year.  Routine eye exams. Ask your health care provider how often you should have your eyes checked.  Personal lifestyle choices, including: ? Daily care of your teeth and gums. ? Regular physical activity. ? Eating a healthy diet. ? Avoiding tobacco and drug use. ? Limiting alcohol use. ? Practicing safe sex. ? Taking vitamin and mineral supplements as recommended by your health care provider. What happens during an annual well check? The services and screenings done by your health care provider during your annual well check will depend on your age, overall health, lifestyle risk factors, and family history of disease. Counseling Your health care provider may ask you questions about your:  Alcohol use.  Tobacco use.  Drug use.  Emotional well-being.  Home and relationship well-being.  Sexual activity.  Eating habits.  Work and work Statistician.  Method of birth control.  Menstrual cycle.  Pregnancy history. Screening You may have the following tests or measurements:  Height, weight, and BMI.  Diabetes screening. This is done by checking your blood sugar (glucose) after you have not eaten for a while (fasting).  Blood pressure.  Lipid and cholesterol levels. These may be checked every 5 years starting at age 29.  Skin check.  Hepatitis C blood test.  Hepatitis B blood test.  Sexually transmitted disease (STD) testing.  BRCA-related  cancer screening. This may be done if you have a family history of breast, ovarian, tubal, or peritoneal cancers.  Pelvic exam and Pap test. This may be done every 3 years starting at age 46. Starting at age 54, this may be done every 5 years if you have a Pap test in combination with an HPV test. Discuss your test results, treatment options, and if necessary, the need for more tests with your health care provider. Vaccines Your health care provider may recommend certain vaccines, such as:  Influenza vaccine. This is recommended every year.  Tetanus, diphtheria, and acellular pertussis (Tdap, Td) vaccine. You may need a Td booster every 10 years.  Varicella vaccine. You may need this if you have not been vaccinated.  HPV vaccine. If you are 39 or younger, you may need three doses over 6 months.  Measles, mumps, and rubella (MMR) vaccine. You may need at least one dose of MMR. You may also need a second dose.  Pneumococcal 13-valent conjugate (PCV13) vaccine. You may need this if you have certain conditions and were not previously vaccinated.  Pneumococcal polysaccharide (PPSV23) vaccine. You may need one or two doses if you smoke cigarettes or if you have certain conditions.  Meningococcal vaccine. One dose is recommended if you are age 61-21 years and a first-year college student living in a residence hall, or if you have one of several medical conditions. You may also need additional booster doses.  Hepatitis A vaccine. You may need this if you have certain conditions or if you travel or work in places where you may be exposed to hepatitis A.  Hepatitis B vaccine. You may need this if you have certain conditions or if you travel or work in places where you may be exposed to hepatitis B.  Haemophilus influenzae type b (Hib) vaccine. You may need this if you have certain risk factors. Talk to your health care provider about which screenings and vaccines you need and how often you need  them. This information is not intended to replace advice given to you by your health care provider. Make sure you discuss any questions you have with your health care provider. Document Released: 06/29/2001 Document Revised: 12/14/2016 Document Reviewed: 03/04/2015 Elsevier Interactive Patient Education  2019 [employer].

## 2018-06-06 NOTE — Assessment & Plan Note (Signed)
Mirena placed 04/03/18

## 2018-06-06 NOTE — Progress Notes (Signed)
GYNECOLOGY OFFICE ENCOUNTER NOTE  History:  23 y.o. G1P1001 here today for today for IUD string check; Mirena  IUD was placed  04/03/2018. No complaints about the IUD, no concerning side effects.  Denies difficulty breathing or respiratory distress, chest pain, abdominal pain, excessive vaginal bleeding, dysuria, and leg pain or swelling.   The following portions of the patient's history were reviewed and updated as appropriate: allergies, current medications, past family history, past medical history, past social history, past surgical history and problem list. Pap smear is due this year.  Review of Systems:   Pertinent items are noted in HPI.  Objective:   BP (!) 96/56   Pulse 62   Ht 5\' 2"  (1.575 m)   Wt 147 lb 1.6 oz (66.7 kg)   LMP  (LMP Unknown)   BMI 26.90 kg/m   Physical Exam  CONSTITUTIONAL: Well-developed, well-nourished female in no acute distress.   ABDOMEN: Soft, no distention noted.    PELVIC: Normal appearing external genitalia; normal appearing vaginal mucosa and cervix.  IUD strings visualized, about 3 cm in length outside cervix.   Assessment & Plan:   Normal IUD check.  Patient to keep IUD in place for up to five years; can come in for removal if she desires pregnancy earlier or for or concerning side effects.  Encouraged routine health maintenance techniques, see AVS.   Reviewed red flag symptoms and when to call.   RTC x 8 months for ANNUAL with PAP or sooner if needed.    Gunnar Bulla, CNM [facility], [facility] 06/06/18 9:47 AM   Gunnar Bulla, CNM [facility], [facility] 06/06/18 9:43 AM

## 2018-12-13 DIAGNOSIS — K219 Gastro-esophageal reflux disease without esophagitis: Secondary | ICD-10-CM | POA: Diagnosis not present

## 2019-02-05 ENCOUNTER — Encounter: Payer: BLUE CROSS/BLUE SHIELD | Admitting: Certified Nurse Midwife

## 2019-06-21 ENCOUNTER — Telehealth: Payer: Self-pay | Admitting: Family Medicine

## 2019-06-21 NOTE — Telephone Encounter (Signed)
That is fine. Thanks 

## 2019-06-21 NOTE — Telephone Encounter (Signed)
Tried to contact patient, was unable to leave message. If patient calls back please schedule appt to Re Establish care. Thanks!

## 2019-06-21 NOTE — Telephone Encounter (Signed)
Patient called today to Re Est Care. She has not been seen by you since 2017. I wanted to verify with you that it is okay to schedule patient with you to re est her care

## 2019-06-28 ENCOUNTER — Ambulatory Visit (INDEPENDENT_AMBULATORY_CARE_PROVIDER_SITE_OTHER): Payer: 59 | Admitting: Family Medicine

## 2019-06-28 ENCOUNTER — Other Ambulatory Visit: Payer: Self-pay

## 2019-06-28 ENCOUNTER — Encounter: Payer: Self-pay | Admitting: Family Medicine

## 2019-06-28 VITALS — BP 116/64 | HR 72 | Temp 97.7°F | Ht 63.25 in | Wt 135.1 lb

## 2019-06-28 DIAGNOSIS — F332 Major depressive disorder, recurrent severe without psychotic features: Secondary | ICD-10-CM

## 2019-06-28 DIAGNOSIS — F411 Generalized anxiety disorder: Secondary | ICD-10-CM | POA: Diagnosis not present

## 2019-06-28 DIAGNOSIS — B354 Tinea corporis: Secondary | ICD-10-CM

## 2019-06-28 DIAGNOSIS — Z23 Encounter for immunization: Secondary | ICD-10-CM | POA: Diagnosis not present

## 2019-06-28 MED ORDER — KETOCONAZOLE 2 % EX CREA: 1.0000 "application " | TOPICAL_CREAM | Freq: Two times a day (BID) | CUTANEOUS | 0 refills | Status: DC

## 2019-06-28 NOTE — Assessment & Plan Note (Signed)
S/p pregnancy-come more anxiety issues Seeing a counselor  Also psychiatrist  No meds currently

## 2019-06-28 NOTE — Patient Instructions (Addendum)
Flu shot today   Use the ketoconozole cream for your ringworm rash areas  Wash hands frequently and treat pets  If no improvement in 2 weeks let us know   Take care of yourself

## 2019-06-28 NOTE — Progress Notes (Signed)
Subjective:    Patient ID: Ariel Ortega, female    DOB: Jun 20, 1995, 24 y.o.   MRN: [id number]  This visit occurred during the SARS-CoV-2 public health emergency.  Safety protocols were in place, including screening questions prior to the visit, additional usage of staff PPE, and extensive cleaning of exam room while observing appropriate contact time as indicated for disinfecting solutions.    HPI 24 yo female here to re establish care   Wt Readings from Last 3 Encounters:  06/28/19 135 lb 2 oz (61.3 kg)  06/06/18 147 lb 1.6 oz (66.7 kg)  04/03/18 161 lb 8 oz (73.3 kg)   23.75 kg/m    Wants a flu shot - needs this   Has rash/thinks ringworm  Got from her cat  Several on L leg/arm and R face Itch just a little bit   She put an otc product on it  Perhaps it slowed it down  ? What it was   H/o depression and anxiety She goes to therapy for post partum anxiety  She started seeing a psychiatry-no medicines   Had a baby in sept Has IUD for contraception (mirena) ad sees gyn  She gets a period here and there- not heavy  Exam was a year ago  Things are going well   BP Readings from Last 3 Encounters:  06/28/19 116/64  06/06/18 (!) 96/56  04/03/18 (!) 104/59   Pulse Readings from Last 3 Encounters:  06/28/19 72  06/06/18 62  04/03/18 74    Patient Active Problem List   Diagnosis Date Noted  . Tinea corporis 06/28/2019  . IUD (intrauterine device) in place 06/06/2018  . History of drug overdose 05/08/2014  . Panic disorder 04/22/2014  . MDD (major depressive disorder), recurrent episode, severe (HCC) 04/19/2014  . GAD (generalized anxiety disorder) 12/25/2013  . Panic attacks 12/25/2013  . Food allergy 10/02/2012   Past Medical History:  Diagnosis Date  . Anxiety   . Depression 04/19/2014  . Mononucleosis 04/09/11   Past Surgical History:  Procedure Laterality Date  . WISDOM TOOTH EXTRACTION     Social History   Tobacco Use  . Smoking status: Never  Smoker  . Smokeless tobacco: Never Used  Substance Use Topics  . Alcohol use: No    Alcohol/week: 0.0 standard drinks  . Drug use: No   Family History  Adopted: Yes  Problem Relation Age of Onset  . Cancer - Prostate Father   . Hyperlipidemia Father   . Hypertension Father   . Cancer Father        prostate cancer   . Depression Mother   . Anxiety disorder Mother   . Breast cancer Neg Hx   . Ovarian cancer Neg Hx   . Colon cancer Neg Hx    Allergies  Allergen Reactions  . Effexor [Venlafaxine] Nausea And Vomiting  . Milk-Related Compounds Nausea And Vomiting    Lactose intolerance   Current Outpatient Medications on File Prior to Visit  Medication Sig Dispense Refill  . EPINEPHrine (EPIPEN) 0.3 mg/0.3 mL DEVI Inject 0.3 mLs (0.3 mg total) into the muscle once. 2 Device 1  . levonorgestrel (MIRENA) 20 MCG/24HR IUD 1 each by Intrauterine route once.     No current facility-administered medications on file prior to visit.    Review of Systems  Constitutional: Negative for activity change, appetite change, fatigue, fever and unexpected weight change.  HENT: Negative for congestion, ear pain, rhinorrhea, sinus pressure and sore throat.  Eyes: Negative for pain, redness and visual disturbance.  Respiratory: Negative for cough, shortness of breath and wheezing.   Cardiovascular: Negative for chest pain and palpitations.  Gastrointestinal: Negative for abdominal pain, blood in stool, constipation and diarrhea.  Endocrine: Negative for polydipsia and polyuria.  Genitourinary: Negative for dysuria, frequency and urgency.  Musculoskeletal: Negative for arthralgias, back pain and myalgias.  Skin: Positive for rash. Negative for pallor.  Allergic/Immunologic: Negative for environmental allergies.  Neurological: Negative for dizziness, syncope and headaches.  Hematological: Negative for adenopathy. Does not bruise/bleed easily.  Psychiatric/Behavioral: Positive for dysphoric mood.  Negative for decreased concentration. The patient is nervous/anxious.        Anxiety and depression are not as severe as in the past       Objective:   Physical Exam Constitutional:      General: She is not in acute distress.    Appearance: Normal appearance. She is normal weight. She is not ill-appearing.  HENT:     Nose: Nose normal.     Mouth/Throat:     Mouth: Mucous membranes are moist.     Pharynx: Oropharynx is clear.  Eyes:     General: No scleral icterus.    Extraocular Movements: Extraocular movements intact.     Conjunctiva/sclera: Conjunctivae normal.     Pupils: Pupils are equal, round, and reactive to light.  Neck:     Vascular: No carotid bruit.  Cardiovascular:     Rate and Rhythm: Normal rate and regular rhythm.     Pulses: Normal pulses.     Heart sounds: Normal heart sounds.  Pulmonary:     Effort: Pulmonary effort is normal. No respiratory distress.     Breath sounds: Normal breath sounds. No wheezing or rales.  Abdominal:     General: Abdomen is flat. There is no distension.  Musculoskeletal:     Cervical back: Normal range of motion and neck supple.  Lymphadenopathy:     Cervical: No cervical adenopathy.  Skin:    General: Skin is warm and dry.     Coloration: Skin is not jaundiced or pale.     Findings: Rash present.     Comments: Several oval areas of erythema and induration with central clearing of different sizes on L upper and lower thigh, inner L upper arm and small one on R temple   No other skin changes Some lentigines  Neurological:     Mental Status: She is alert.     Sensory: No sensory deficit.     Coordination: Coordination normal.     Deep Tendon Reflexes: Reflexes normal.  Psychiatric:        Attention and Perception: Attention normal.        Mood and Affect: Mood is anxious.        Speech: Speech normal.        Cognition and Memory: Cognition and memory normal.     Comments: Pleasant  Mildly anxious but not seemingly  depressed today           Assessment & Plan:   Problem List Items Addressed This Visit      Musculoskeletal and Integument   Tinea corporis - Primary    Areas on L leg and arm and R face  Thinks she got this from a cat  inst to keep clean with soap and water and dry well  Ketoconazole cream 2% given to use bid to affected areas inst to call if not improved in 2 weeks (or  if worse)        Relevant Medications   ketoconazole (NIZORAL) 2 % cream     Other   GAD (generalized anxiety disorder)    Sees a psychiatrist and counselor  No meds currently  Had a baby in sept -some post partum anxiety -working through it       MDD (major depressive disorder), recurrent episode, severe (HCC)    S/p pregnancy-come more anxiety issues Seeing a counselor  Also psychiatrist  No meds currently        Other Visit Diagnoses    Need for influenza vaccination       Relevant Orders   Flu Vaccine QUAD 6+ mos PF IM (Fluarix Quad PF) (Completed)

## 2019-06-28 NOTE — Assessment & Plan Note (Signed)
Areas on L leg and arm and R face  Thinks she got this from a cat  inst to keep clean with soap and water and dry well  Ketoconazole cream 2% given to use bid to affected areas inst to call if not improved in 2 weeks (or if worse)

## 2019-06-28 NOTE — Assessment & Plan Note (Signed)
Sees a psychiatrist and counselor  No meds currently  Had a baby in sept -some post partum anxiety -working through it

## 2019-10-22 ENCOUNTER — Ambulatory Visit (INDEPENDENT_AMBULATORY_CARE_PROVIDER_SITE_OTHER): Payer: 59 | Admitting: Family Medicine

## 2019-10-22 ENCOUNTER — Encounter: Payer: Self-pay | Admitting: Family Medicine

## 2019-10-22 ENCOUNTER — Other Ambulatory Visit: Payer: Self-pay

## 2019-10-22 VITALS — BP 122/68 | HR 88 | Temp 97.6°F | Ht 63.25 in | Wt 137.4 lb

## 2019-10-22 DIAGNOSIS — F900 Attention-deficit hyperactivity disorder, predominantly inattentive type: Secondary | ICD-10-CM | POA: Diagnosis not present

## 2019-10-22 DIAGNOSIS — F332 Major depressive disorder, recurrent severe without psychotic features: Secondary | ICD-10-CM | POA: Diagnosis not present

## 2019-10-22 DIAGNOSIS — F411 Generalized anxiety disorder: Secondary | ICD-10-CM

## 2019-10-22 DIAGNOSIS — B079 Viral wart, unspecified: Secondary | ICD-10-CM

## 2019-10-22 DIAGNOSIS — Z8659 Personal history of other mental and behavioral disorders: Secondary | ICD-10-CM

## 2019-10-22 MED ORDER — AMPHETAMINE-DEXTROAMPHETAMINE 10 MG PO TABS: 10.0000 mg | ORAL_TABLET | Freq: Two times a day (BID) | ORAL | 0 refills | Status: DC

## 2019-10-22 NOTE — Assessment & Plan Note (Signed)
Pt thinks this only comes up in response to frustration from attention deficit Ssri in the past not helpful

## 2019-10-22 NOTE — Assessment & Plan Note (Addendum)
This procedure has been fully reviewed with the patient and written informed consent has been obtained.   Simple wart on dorsal L index finger and medial 4th finger with liquid nitrogen cryo tx times 3 (freeze and thaw)  Pt tolerated well  Aftercare discussed  F/u if she needs re tx based on response

## 2019-10-22 NOTE — Assessment & Plan Note (Signed)
Spent over 30 minutes reviewing testing results pt brought with her  Rev diag of ADD and meaning to her  Psych counseling ref made - goal to develop coping mechanisms for day to day  Trial of adderall 10 mg bid-to titrate up as needed Rev poss side eff Consider change if not helpful

## 2019-10-22 NOTE — Assessment & Plan Note (Signed)
Pt gets anxious when overwhelmed and unable to concentrate No help with ssri in the past  Will tx ADD

## 2019-10-22 NOTE — Progress Notes (Signed)
Subjective:    Patient ID: Ariel Ortega, female    DOB: [birth year], 24 y.o.   MRN: [id number]  This visit occurred during the SARS-CoV-2 public health emergency.  Safety protocols were in place, including screening questions prior to the visit, additional usage of staff PPE, and extensive cleaning of exam room while observing appropriate contact time as indicated for disinfecting solutions.    HPI Pt presents for evaluation of warts on hand and also attention/concentration issues   Wt Readings from Last 3 Encounters:  10/22/19 137 lb 7 oz (62.3 kg)  06/28/19 135 lb 2 oz (61.3 kg)  06/06/18 147 lb 1.6 oz (66.7 kg)   24.15 kg/m  She completed a formal assessment for ADHD with diagnosis of ADHD predominant inattentive  She did this on line with Dr Loletha Grayer (psy D Brought paperwork with her   She has hx of GAD and MDD in the past  She lately is more overwhelmed than depressed (and frustrated) -esp now that she has a child  More anxious when she feels overwhelmed by stimuli or to do list  Sometimes engaging with people (has to work hard to [employer] friendships)   prozac and paxil in the past- did not help much  Hydroxyzine-did not remember taking   fam hx: Mother-depression  Brother- attention issues (she suspects)     Upon review-pt sites difficulty paying attention  Had much trouble in school and struggled in all subjects  Day dreaming, not completing work were the big issues (no behavior problems)  Attendance issues followed her through many jobs- due to not retaining directions she was given Emotional state is scared or worried  Feels defeated by multiple failures  Also irritable  Easily distracted/sensitive to things Often in a fog   Diagnosis based on  Symptoms present before age 72, for over 6 mo Present in many social situations and interfering with daily fxn Not better expl by another mental health disorder   Recommended psych counseling Also psychiatric ref or disc  of medication   Substance use -has experimented with alcohol Does not use marjuana now (in the past it used to help her sleep)  Coping mechanisms  Keeps 2 calenders  Often sets 2 alarms  Coffee sometimes helps   Wants to return to school   Has a wart -L index finger and ring finger  She has tried band aids and comp W - home freezing   Patient Active Problem List   Diagnosis Date Noted  . ADHD (attention deficit hyperactivity disorder), inattentive type 10/22/2019  . Tinea corporis 06/28/2019  . IUD (intrauterine device) in place 06/06/2018  . History of drug overdose 05/08/2014  . Panic disorder 04/22/2014  . History of depression 04/19/2014  . GAD (generalized anxiety disorder) 12/25/2013  . Panic attacks 12/25/2013  . Wart 11/06/2012  . Food allergy 10/02/2012   Past Medical History:  Diagnosis Date  . Anxiety   . Depression 04/19/2014  . Mononucleosis 04/09/11   Past Surgical History:  Procedure Laterality Date  . WISDOM TOOTH EXTRACTION     Social History   Tobacco Use  . Smoking status: Never Smoker  . Smokeless tobacco: Never Used  Substance Use Topics  . Alcohol use: No    Alcohol/week: 0.0 standard drinks  . Drug use: No   Family History  Adopted: Yes  Problem Relation Age of Onset  . Cancer - Prostate Father   . Hyperlipidemia Father   . Hypertension Father   . Cancer Father  prostate cancer   . Depression Mother   . Anxiety disorder Mother   . Breast cancer Neg Hx   . Ovarian cancer Neg Hx   . Colon cancer Neg Hx    Allergies  Allergen Reactions  . Effexor [Venlafaxine] Nausea And Vomiting  . Milk-Related Compounds Nausea And Vomiting    Lactose intolerance   Current Outpatient Medications on File Prior to Visit  Medication Sig Dispense Refill  . EPINEPHrine (EPIPEN) 0.3 mg/0.3 mL DEVI Inject 0.3 mLs (0.3 mg total) into the muscle once. 2 Device 1  . ketoconazole (NIZORAL) 2 % cream Apply 1 application topically 2 (two) times  daily. To affected areas 30 g 0  . levonorgestrel (MIRENA) 20 MCG/24HR IUD 1 each by Intrauterine route once.     No current facility-administered medications on file prior to visit.     Review of Systems  Constitutional: Negative for activity change, appetite change, fatigue, fever and unexpected weight change.  HENT: Negative for congestion, ear pain, rhinorrhea, sinus pressure and sore throat.   Eyes: Negative for pain, redness and visual disturbance.  Respiratory: Negative for cough, shortness of breath and wheezing.   Cardiovascular: Negative for chest pain and palpitations.  Gastrointestinal: Negative for abdominal pain, blood in stool, constipation and diarrhea.  Endocrine: Negative for polydipsia and polyuria.  Genitourinary: Negative for dysuria, frequency and urgency.  Musculoskeletal: Negative for arthralgias, back pain and myalgias.  Skin: Negative for pallor and rash.  Allergic/Immunologic: Negative for environmental allergies.  Neurological: Negative for dizziness, syncope and headaches.  Hematological: Negative for adenopathy. Does not bruise/bleed easily.  Psychiatric/Behavioral: Positive for decreased concentration and dysphoric mood. Negative for confusion, sleep disturbance and suicidal ideas. The patient is nervous/anxious. The patient is not hyperactive.        Objective:   Physical Exam Constitutional:      General: She is not in acute distress.    Appearance: Normal appearance. She is well-developed and normal weight. She is not ill-appearing.  HENT:     Head: Normocephalic and atraumatic.  Eyes:     General: No scleral icterus.    Conjunctiva/sclera: Conjunctivae normal.     Pupils: Pupils are equal, round, and reactive to light.  Neck:     Thyroid: No thyromegaly.     Vascular: No carotid bruit or JVD.  Cardiovascular:     Rate and Rhythm: Normal rate and regular rhythm.     Heart sounds: Normal heart sounds. No gallop.   Pulmonary:     Effort:  Pulmonary effort is normal. No respiratory distress.     Breath sounds: Normal breath sounds. No wheezing or rales.  Abdominal:     General: Bowel sounds are normal. There is no distension or abdominal bruit.     Palpations: Abdomen is soft. There is no mass.     Tenderness: There is no abdominal tenderness.  Musculoskeletal:     Cervical back: Normal range of motion and neck supple.  Lymphadenopathy:     Cervical: No cervical adenopathy.  Skin:    General: Skin is warm and dry.     Coloration: Skin is not pale.     Findings: No erythema or rash.     Comments: Warts- on L hand (dorsal index finger and medial 4th finger) tx with cryo tx  Tolerated well  Neurological:     Mental Status: She is alert.     Sensory: No sensory deficit.     Coordination: Coordination normal.     Deep  Tendon Reflexes: Reflexes are normal and symmetric. Reflexes normal.     Comments: No tremor   Psychiatric:        Attention and Perception: She is attentive.        Mood and Affect: Mood is anxious.        Speech: Speech normal.        Behavior: Behavior normal.        Thought Content: Thought content is not paranoid or delusional.        Cognition and Memory: Cognition and memory normal.     Comments: Mildly anxious  Pleasant  Normally attentive during this short visit  Candidly discusses her symptoms nad mood  Not tearful           Assessment & Plan:   Problem List Items Addressed This Visit      Other   Wart    Simple wart on dorsal L index finger and medial 4th finger with liquid nitrogen cryo tx times 3 (freeze and thaw)  Pt tolerated well  Aftercare discussed  F/u if she needs re tx based on response      GAD (generalized anxiety disorder)    Pt gets anxious when overwhelmed and unable to concentrate No help with ssri in the past  Will tx ADD      Relevant Orders   Ambulatory referral to Psychology   History of depression    Pt thinks this only comes up in response to  frustration from attention deficit Ssri in the past not helpful      ADHD (attention deficit hyperactivity disorder), inattentive type - Primary    Spent over 30 minutes reviewing testing results pt brought with her  Rev diag of ADD and meaning to her  Psych counseling ref made - goal to develop coping mechanisms for day to day  Trial of adderall 10 mg bid-to titrate up as needed Rev poss side eff Consider change if not helpful       Relevant Orders   Ambulatory referral to Psychology

## 2019-10-22 NOTE — Patient Instructions (Addendum)
Start with adderall 10 mg - am and mid day  Stop and call if side effects Update me in a month re: whether we should increase the dose  Call me with that update    I referred you for counseling -the office will call you about that   I treated 2 warts on your left hand  The areas will blister-keep clean/cover if needed   If not much improved in 2-3 weeks we can re treat them

## 2019-11-27 ENCOUNTER — Other Ambulatory Visit: Payer: Self-pay

## 2019-11-28 ENCOUNTER — Ambulatory Visit: Payer: 59 | Admitting: Family Medicine

## 2019-11-30 ENCOUNTER — Encounter: Payer: Self-pay | Admitting: Family Medicine

## 2019-11-30 ENCOUNTER — Ambulatory Visit: Payer: 59 | Admitting: Family Medicine

## 2019-11-30 ENCOUNTER — Other Ambulatory Visit: Payer: Self-pay

## 2019-11-30 VITALS — BP 108/62 | HR 64 | Temp 97.1°F | Ht 63.25 in | Wt 136.2 lb

## 2019-11-30 DIAGNOSIS — F900 Attention-deficit hyperactivity disorder, predominantly inattentive type: Secondary | ICD-10-CM

## 2019-11-30 DIAGNOSIS — B079 Viral wart, unspecified: Secondary | ICD-10-CM

## 2019-11-30 MED ORDER — AMPHETAMINE-DEXTROAMPHETAMINE 20 MG PO TABS: 20.0000 mg | ORAL_TABLET | Freq: Two times a day (BID) | ORAL | 0 refills | Status: DC

## 2019-11-30 NOTE — Assessment & Plan Note (Signed)
Mild improvement with adderall 10 mg bid Will inc it to 20 mg bid  Disc poss side eff to watch for -inst to update  In a month- asked to let us know how it worked Again strongly recommend good self care/organizaiton and time [employer]	[place]

## 2019-11-30 NOTE — Assessment & Plan Note (Signed)
After consent obtained- warts on L index and lateral 4th fingers tx by liquid nitrogen full freeze and thaw times 3 Pt tolerated well  Disc aftercare  Once healed-can also use wart plaster/patch or compound W and gentle exfoliation inst to call if no further improvement in a month

## 2019-11-30 NOTE — Progress Notes (Signed)
Subjective:    Patient ID: Ariel Ortega, female    DOB: [birth year], 24 y.o.   MRN: [id number]  This visit occurred during the SARS-CoV-2 public health emergency.  Safety protocols were in place, including screening questions prior to the visit, additional usage of staff PPE, and extensive cleaning of exam room while observing appropriate contact time as indicated for disinfecting solutions.    HPI Pt presents for warts on hand  Have been tx in the past (liq nit cryo last visit) L index finger and medial 4th finger Also to discuss adderall dosage  Px adderall 10 mg to take bid at last visit  Also ref to counseling  Has helped her some  Feels less flustered and frustrated  Towards end of the day she can feel a difference   First few days- was tired and appetite was off- but that was brief   Warts are still there and hurt  ? If went down in size   Planned for repeat cryo tx of warts today This procedure has been fully reviewed with the patient and written informed consent has been obtained.  Patient Active Problem List   Diagnosis Date Noted  . ADHD (attention deficit hyperactivity disorder), inattentive type 10/22/2019  . Tinea corporis 06/28/2019  . IUD (intrauterine device) in place 06/06/2018  . History of drug overdose 05/08/2014  . Panic disorder 04/22/2014  . History of depression 04/19/2014  . GAD (generalized anxiety disorder) 12/25/2013  . Panic attacks 12/25/2013  . Wart 11/06/2012  . Food allergy 10/02/2012   Past Medical History:  Diagnosis Date  . Anxiety   . Depression 04/19/2014  . Mononucleosis 04/09/11   Past Surgical History:  Procedure Laterality Date  . WISDOM TOOTH EXTRACTION     Social History   Tobacco Use  . Smoking status: Never Smoker  . Smokeless tobacco: Never Used  Vaping Use  . Vaping Use: Never used  Substance Use Topics  . Alcohol use: No    Alcohol/week: 0.0 standard drinks  . Drug use: No   Family History  Adopted: Yes    Problem Relation Age of Onset  . Cancer - Prostate Father   . Hyperlipidemia Father   . Hypertension Father   . Cancer Father        prostate cancer   . Depression Mother   . Anxiety disorder Mother   . Breast cancer Neg Hx   . Ovarian cancer Neg Hx   . Colon cancer Neg Hx    Allergies  Allergen Reactions  . Effexor [Venlafaxine] Nausea And Vomiting  . Milk-Related Compounds Nausea And Vomiting    Lactose intolerance   Current Outpatient Medications on File Prior to Visit  Medication Sig Dispense Refill  . EPINEPHrine (EPIPEN) 0.3 mg/0.3 mL DEVI Inject 0.3 mLs (0.3 mg total) into the muscle once. 2 Device 1  . ketoconazole (NIZORAL) 2 % cream Apply 1 application topically 2 (two) times daily. To affected areas 30 g 0  . levonorgestrel (MIRENA) 20 MCG/24HR IUD 1 each by Intrauterine route once.     No current facility-administered medications on file prior to visit.    Review of Systems  Constitutional: Negative for activity change, appetite change, fatigue, fever and unexpected weight change.  HENT: Negative for congestion, ear pain, rhinorrhea, sinus pressure and sore throat.   Eyes: Negative for pain, redness and visual disturbance.  Respiratory: Negative for cough, shortness of breath and wheezing.   Cardiovascular: Negative for chest pain and palpitations.  Gastrointestinal: Negative for abdominal pain, blood in stool, constipation and diarrhea.  Endocrine: Negative for polydipsia and polyuria.  Genitourinary: Negative for dysuria, frequency and urgency.  Musculoskeletal: Negative for arthralgias, back pain and myalgias.  Skin: Negative for pallor and rash.       Warts persist on L hand  Allergic/Immunologic: Negative for environmental allergies.  Neurological: Negative for dizziness, syncope and headaches.  Hematological: Negative for adenopathy. Does not bruise/bleed easily.  Psychiatric/Behavioral: Positive for decreased concentration. Negative for dysphoric mood.  The patient is nervous/anxious.        Objective:   Physical Exam Constitutional:      General: She is not in acute distress.    Appearance: Normal appearance. She is well-developed and normal weight.  HENT:     Head: Normocephalic and atraumatic.  Eyes:     General: No scleral icterus.    Conjunctiva/sclera: Conjunctivae normal.     Pupils: Pupils are equal, round, and reactive to light.  Neck:     Thyroid: No thyromegaly.     Vascular: No carotid bruit or JVD.  Cardiovascular:     Rate and Rhythm: Normal rate and regular rhythm.     Heart sounds: Normal heart sounds. No gallop.   Pulmonary:     Effort: Pulmonary effort is normal. No respiratory distress.     Breath sounds: Normal breath sounds. No wheezing or rales.  Abdominal:     General: Bowel sounds are normal. There is no distension or abdominal bruit.     Palpations: Abdomen is soft. There is no mass.     Tenderness: There is no abdominal tenderness.  Musculoskeletal:     Cervical back: Normal range of motion and neck supple.  Lymphadenopathy:     Cervical: No cervical adenopathy.  Skin:    General: Skin is warm and dry.     Coloration: Skin is not pale.     Findings: No erythema or rash.     Comments: Simple warts - with smooth appearance noted on dorsal/distal L index finger and lateral L 4th finger Pink in color No open skin or wound noted  Neurological:     Mental Status: She is alert.     Sensory: No sensory deficit.     Motor: No tremor or abnormal muscle tone.     Coordination: Coordination normal.     Deep Tendon Reflexes: Reflexes are normal and symmetric.  Psychiatric:        Attention and Perception: Attention normal.        Mood and Affect: Mood normal.        Cognition and Memory: Cognition and memory normal.           Assessment & Plan:   Problem List Items Addressed This Visit      Other   Wart - Primary    After consent obtained- warts on L index and lateral 4th fingers tx by  liquid nitrogen full freeze and thaw times 3 Pt tolerated well  Disc aftercare  Once healed-can also use wart plaster/patch or compound W and gentle exfoliation inst to call if no further improvement in a month      ADHD (attention deficit hyperactivity disorder), inattentive type    Mild improvement with adderall 10 mg bid Will inc it to 20 mg bid  Disc poss side eff to watch for -inst to update  In a month- asked to let us know how it worked Again strongly recommend good self care/organizaiton and time [employer]	[place]

## 2019-11-30 NOTE — Patient Instructions (Signed)
Keep wart areas clean soap and water - cover if needed   You can use compound W or one of the wart stickers/band aids to work on this further   Let us know if no further improvement in a month   Increase adderall to 20 mg twice daily   Give that a month and let me know how it goes

## 2019-12-14 ENCOUNTER — Ambulatory Visit: Payer: 59 | Admitting: Family Medicine

## 2019-12-14 ENCOUNTER — Other Ambulatory Visit: Payer: Self-pay

## 2019-12-14 ENCOUNTER — Encounter: Payer: Self-pay | Admitting: Family Medicine

## 2019-12-14 VITALS — BP 106/64 | HR 87 | Temp 97.2°F | Ht 63.25 in | Wt 133.4 lb

## 2019-12-14 DIAGNOSIS — R202 Paresthesia of skin: Secondary | ICD-10-CM | POA: Diagnosis not present

## 2019-12-14 DIAGNOSIS — B079 Viral wart, unspecified: Secondary | ICD-10-CM | POA: Diagnosis not present

## 2019-12-14 NOTE — Assessment & Plan Note (Signed)
With positive tinel and phalen signs-more on R Also hand stiffness- ? If involved  Recommend use of wrist splints at night Use baby/child carrier to take weight off hands when able  Aleve bid with food  If no imp in 14d consider nerve testing

## 2019-12-14 NOTE — Assessment & Plan Note (Signed)
Warts on hand are smaller but not resolving Pt asked for derm referral That was done

## 2019-12-14 NOTE — Progress Notes (Signed)
Subjective:    Patient ID: Ariel Ortega, female    DOB: March 28, 1996, 24 y.o.   MRN: [id number]  This visit occurred during the SARS-CoV-2 public health emergency.  Safety protocols were in place, including screening questions prior to the visit, additional usage of staff PPE, and extensive cleaning of exam room while observing appropriate contact time as indicated for disinfecting solutions.    HPI Pt presents for hand numbness for 1 week   Wt Readings from Last 3 Encounters:  12/14/19 133 lb 6 oz (60.5 kg)  11/30/19 136 lb 3.2 oz (61.8 kg)  10/22/19 137 lb 7 oz (62.3 kg)   23.44 kg/m  Had an incident when she injured her hand opening a difficult gate right hand Had bruising palm - then stiffness and then numbness  About a week ago ? If the injury has anything to do with it  Then other had started feeling stiff also / sore    Tingling sensation in both hands R hand- from elbow to fingers L is just hand   All fingers feel it equally   Perhaps R hand -perhaps a little swelling   Tends to hold daughter on the L so she uses right hand for everything Not a lot of keyboarding   Grasping is most uncomfortable on the R No fam hx of joint dz   Warts are not going away  Wants to see dermatology Scabbed after last tx and then came back Using wart tx band aides  Patient Active Problem List   Diagnosis Date Noted  . Hand paresthesia 12/14/2019  . ADHD (attention deficit hyperactivity disorder), inattentive type 10/22/2019  . Tinea corporis 06/28/2019  . IUD (intrauterine device) in place 06/06/2018  . History of drug overdose 05/08/2014  . Panic disorder 04/22/2014  . History of depression 04/19/2014  . GAD (generalized anxiety disorder) 12/25/2013  . Panic attacks 12/25/2013  . Wart 11/06/2012  . Food allergy 10/02/2012   Past Medical History:  Diagnosis Date  . Anxiety   . Depression 04/19/2014  . Mononucleosis 04/09/11   Past Surgical History:  Procedure  Laterality Date  . WISDOM TOOTH EXTRACTION     Social History   Tobacco Use  . Smoking status: Never Smoker  . Smokeless tobacco: Never Used  Vaping Use  . Vaping Use: Never used  Substance Use Topics  . Alcohol use: No    Alcohol/week: 0.0 standard drinks  . Drug use: No   Family History  Adopted: Yes  Problem Relation Age of Onset  . Cancer - Prostate Father   . Hyperlipidemia Father   . Hypertension Father   . Cancer Father        prostate cancer   . Depression Mother   . Anxiety disorder Mother   . Breast cancer Neg Hx   . Ovarian cancer Neg Hx   . Colon cancer Neg Hx    Allergies  Allergen Reactions  . Effexor [Venlafaxine] Nausea And Vomiting  . Milk-Related Compounds Nausea And Vomiting    Lactose intolerance   Current Outpatient Medications on File Prior to Visit  Medication Sig Dispense Refill  . amphetamine-dextroamphetamine (ADDERALL) 20 MG tablet Take 1 tablet (20 mg total) by mouth 2 (two) times daily. 60 tablet 0  . EPINEPHrine (EPIPEN) 0.3 mg/0.3 mL DEVI Inject 0.3 mLs (0.3 mg total) into the muscle once. 2 Device 1  . ketoconazole (NIZORAL) 2 % cream Apply 1 application topically 2 (two) times daily. To affected areas 30 g  0  . levonorgestrel (MIRENA) 20 MCG/24HR IUD 1 each by Intrauterine route once.     No current facility-administered medications on file prior to visit.    Review of Systems  Constitutional: Negative for activity change, appetite change, fatigue, fever and unexpected weight change.  HENT: Negative for congestion, ear pain, rhinorrhea, sinus pressure and sore throat.   Eyes: Negative for pain, redness and visual disturbance.  Respiratory: Negative for cough, shortness of breath and wheezing.   Cardiovascular: Negative for chest pain and palpitations.  Gastrointestinal: Negative for abdominal pain, blood in stool, constipation and diarrhea.  Endocrine: Negative for polydipsia and polyuria.  Genitourinary: Negative for dysuria,  frequency and urgency.  Musculoskeletal: Positive for arthralgias. Negative for back pain and myalgias.  Skin: Negative for pallor and rash.  Allergic/Immunologic: Negative for environmental allergies.  Neurological: Positive for numbness. Negative for dizziness, syncope and headaches.  Hematological: Negative for adenopathy. Does not bruise/bleed easily.  Psychiatric/Behavioral: Negative for decreased concentration and dysphoric mood. The patient is not nervous/anxious.        Objective:   Physical Exam Constitutional:      General: She is not in acute distress.    Appearance: Normal appearance. She is not ill-appearing.  HENT:     Head: Normocephalic and atraumatic.  Eyes:     General: No scleral icterus.    Conjunctiva/sclera: Conjunctivae normal.     Pupils: Pupils are equal, round, and reactive to light.  Cardiovascular:     Rate and Rhythm: Normal rate and regular rhythm.     Heart sounds: Normal heart sounds.  Pulmonary:     Effort: Pulmonary effort is normal. No respiratory distress.  Musculoskeletal:        General: No swelling or deformity.     Cervical back: Normal range of motion and neck supple. No tenderness.     Right lower leg: No edema.     Left lower leg: No edema.     Comments: No joint deformity/changes or tenderness in hands  R wrist it mildly tender over carpal tunnel Nl rom of UEs  Skin:    General: Skin is warm and dry.     Coloration: Skin is not pale.     Findings: No erythema or rash.     Comments: Warts noted on L hand  Neurological:     Mental Status: She is alert.     Coordination: Coordination normal.     Deep Tendon Reflexes: Reflexes normal.     Comments: Positive tinel and phalen tests bilateral wrists (moreso on R)   Psychiatric:        Mood and Affect: Mood normal.           Assessment & Plan:   Problem List Items Addressed This Visit      Other   Wart - Primary    Warts on hand are smaller but not resolving Pt asked for  derm referral That was done      Relevant Orders   Ambulatory referral to Dermatology   Hand paresthesia    With positive tinel and phalen signs-more on R Also hand stiffness- ? If involved  Recommend use of wrist splints at night Use baby/child carrier to take weight off hands when able  Aleve bid with food  If no imp in 14d consider nerve testing

## 2019-12-14 NOTE — Patient Instructions (Addendum)
You may have carpal tunnel syndrome /or something similar   Get wrist spints from pharmacy for carpal tunnel  Wear them at night   Take aleve 1-2 pills with food up to every 12 hours for 1-2 weeks  If you develop joint swelling or redness let us know  In 2 weeks if not significantly improved- please call   Our office will call you regarding dermatology referral

## 2019-12-18 ENCOUNTER — Ambulatory Visit: Payer: 59 | Admitting: Psychology

## 2019-12-25 ENCOUNTER — Encounter: Payer: Self-pay | Admitting: *Deleted

## 2020-01-01 ENCOUNTER — Encounter: Payer: Self-pay | Admitting: Family Medicine

## 2020-01-01 MED ORDER — AMPHETAMINE-DEXTROAMPHETAMINE 20 MG PO TABS: 20.0000 mg | ORAL_TABLET | Freq: Two times a day (BID) | ORAL | 0 refills | Status: DC

## 2020-01-01 NOTE — Telephone Encounter (Signed)
Name of Medication: Adderall Name of Pharmacy: [facility] Last Fill or Written Date and Quantity: 11/30/19 #60/ 0 refills Last Office Visit and Type: F/U on 11/30/19 Next Office Visit and Type: none scheduled

## 2020-01-04 ENCOUNTER — Telehealth: Payer: Self-pay | Admitting: Family Medicine

## 2020-01-04 NOTE — Telephone Encounter (Signed)
Aware, thanks for letting me know  

## 2020-01-04 NOTE — Telephone Encounter (Signed)
Pt returned your call.  She is aware rx was sent on 8/17

## 2020-01-04 NOTE — Telephone Encounter (Signed)
Pt called stating she cannot afford derm appointment right now

## 2020-01-04 NOTE — Telephone Encounter (Signed)
I didn't call it may have bee [facility] calling regarding referral, phone note has been sent to them too

## 2020-01-25 ENCOUNTER — Other Ambulatory Visit: Payer: Self-pay

## 2020-01-25 ENCOUNTER — Ambulatory Visit: Payer: 59 | Admitting: Family Medicine

## 2020-01-25 ENCOUNTER — Encounter: Payer: Self-pay | Admitting: Family Medicine

## 2020-01-25 VITALS — BP 90/78 | HR 90 | Temp 97.5°F | Ht 63.25 in | Wt 130.5 lb

## 2020-01-25 DIAGNOSIS — Z23 Encounter for immunization: Secondary | ICD-10-CM

## 2020-01-25 DIAGNOSIS — M25519 Pain in unspecified shoulder: Secondary | ICD-10-CM | POA: Insufficient documentation

## 2020-01-25 DIAGNOSIS — M542 Cervicalgia: Secondary | ICD-10-CM | POA: Diagnosis not present

## 2020-01-25 DIAGNOSIS — G8929 Other chronic pain: Secondary | ICD-10-CM

## 2020-01-25 DIAGNOSIS — M25511 Pain in right shoulder: Secondary | ICD-10-CM

## 2020-01-25 DIAGNOSIS — M25512 Pain in left shoulder: Secondary | ICD-10-CM

## 2020-01-25 MED ORDER — MELOXICAM 15 MG PO TABS: 15.0000 mg | ORAL_TABLET | Freq: Every day | ORAL | 1 refills | Status: DC | PRN

## 2020-01-25 MED ORDER — METHOCARBAMOL 500 MG PO TABS: 500.0000 mg | ORAL_TABLET | Freq: Three times a day (TID) | ORAL | 0 refills | Status: DC | PRN

## 2020-01-25 NOTE — Progress Notes (Signed)
Subjective:    Patient ID: Ariel Ortega, female    DOB: 04/30/1996, 24 y.o.   MRN: [id number]  This visit occurred during the SARS-CoV-2 public health emergency.  Safety protocols were in place, including screening questions prior to the visit, additional usage of staff PPE, and extensive cleaning of exam room while observing appropriate contact time as indicated for disinfecting solutions.    HPI Pt presents with pain in neck/shoulders  Wt Readings from Last 3 Encounters:  01/25/20 130 lb 8 oz (59.2 kg)  12/14/19 133 lb 6 oz (60.5 kg)  11/30/19 136 lb 3.2 oz (61.8 kg)   22.93 kg/m  Last saw chiropractor on 9/8  (had 16 visits and did well and then it came back)  Hurts to pick up her baby  Told she had muscle fatigue  Symptoms for 2 weeks Ibuprofen otc no longer helping as much (800 mg)   Pain started on the L side -now it is starting to move over  Between neck and shoulder  Worse to hold something heavy  Also to rotate head side to side   Tight feeling  Jabs of sharp pain randomly   No pain between shoulder blades   Right before bed is uncomfortable  Now sleeps on her back - (foam pillow) -that helps a lot (better in am)    Also has carpal tunnel  Hands/thumbs feel stiff lately   Patient Active Problem List   Diagnosis Date Noted   Neck pain 01/25/2020   Shoulder pain 01/25/2020   Hand paresthesia 12/14/2019   ADHD (attention deficit hyperactivity disorder), inattentive type 10/22/2019   Tinea corporis 06/28/2019   IUD (intrauterine device) in place 06/06/2018   History of drug overdose 05/08/2014   Panic disorder 04/22/2014   History of depression 04/19/2014   GAD (generalized anxiety disorder) 12/25/2013   Panic attacks 12/25/2013   Wart 11/06/2012   Food allergy 10/02/2012   Past Medical History:  Diagnosis Date   Anxiety    Depression 04/19/2014   Mononucleosis 04/09/11   Past Surgical History:  Procedure Laterality Date    WISDOM TOOTH EXTRACTION     Social History   Tobacco Use   Smoking status: Never Smoker   Smokeless tobacco: Never Used  Vaping Use   Vaping Use: Never used  Substance Use Topics   Alcohol use: No    Alcohol/week: 0.0 standard drinks   Drug use: No   Family History  Adopted: Yes  Problem Relation Age of Onset   Cancer - Prostate Father    Hyperlipidemia Father    Hypertension Father    Cancer Father        prostate cancer    Depression Mother    Anxiety disorder Mother    Breast cancer Neg Hx    Ovarian cancer Neg Hx    Colon cancer Neg Hx    Allergies  Allergen Reactions   Effexor [Venlafaxine] Nausea And Vomiting   Milk-Related Compounds Nausea And Vomiting    Lactose intolerance   Current Outpatient Medications on File Prior to Visit  Medication Sig Dispense Refill   amphetamine-dextroamphetamine (ADDERALL) 20 MG tablet Take 1 tablet (20 mg total) by mouth 2 (two) times daily. 60 tablet 0   EPINEPHrine (EPIPEN) 0.3 mg/0.3 mL DEVI Inject 0.3 mLs (0.3 mg total) into the muscle once. (Patient taking differently: Inject 0.3 mg into the muscle as needed. ) 2 Device 1   levonorgestrel (MIRENA) 20 MCG/24HR IUD 1 each by Intrauterine route once.  No current facility-administered medications on file prior to visit.     Review of Systems  Constitutional: Negative for activity change, appetite change, fatigue, fever and unexpected weight change.  HENT: Negative for congestion, ear pain, rhinorrhea, sinus pressure and sore throat.   Eyes: Negative for pain, redness and visual disturbance.  Respiratory: Negative for cough, shortness of breath and wheezing.   Cardiovascular: Negative for chest pain and palpitations.  Gastrointestinal: Negative for abdominal pain, blood in stool, constipation and diarrhea.  Endocrine: Negative for polydipsia and polyuria.  Genitourinary: Negative for dysuria, frequency and urgency.  Musculoskeletal: Positive for neck  pain. Negative for arthralgias, back pain and myalgias.  Skin: Negative for pallor and rash.  Allergic/Immunologic: Negative for environmental allergies.  Neurological: Positive for numbness. Negative for dizziness, syncope and headaches.  Hematological: Negative for adenopathy. Does not bruise/bleed easily.  Psychiatric/Behavioral: Negative for decreased concentration and dysphoric mood. The patient is not nervous/anxious.        Objective:   Physical Exam Constitutional:      General: She is not in acute distress.    Appearance: Normal appearance. She is normal weight. She is not ill-appearing.  Musculoskeletal:     Cervical back: Tenderness present. No swelling, deformity, erythema, torticollis or crepitus. Pain with movement present. Normal range of motion.     Comments: CS-some pain with full extension and L rotation/tilt  Mild spinal tenderness lower CS Tender in trapezius area /not in scapula  Shoulders-nl rom  R shoulder-mildly pos hawking test and pain with internal rotation  Mild acromion tenderness  No crepitus     Skin:    General: Skin is warm and dry.     Coloration: Skin is not pale.     Findings: No erythema or rash.  Neurological:     Mental Status: She is alert.     Cranial Nerves: No cranial nerve deficit.     Motor: No weakness.     Coordination: Coordination normal.     Deep Tendon Reflexes: Reflexes normal.     Comments: Sensory changes in hands from carpal tunnel  Psychiatric:        Mood and Affect: Mood normal.           Assessment & Plan:   Problem List Items Addressed This Visit      Other   Neck pain - Primary    Worse on L and in trapezius  Has had brief relief with chiropractic care  Ref made to PT for this and R shoulder pain  Px meloxicam and robaxin -prn Adv heat to relax muscles       Relevant Orders   Ambulatory referral to Physical Therapy   Shoulder pain    Worse on R Consistent with tendonitis  Disc use of ice and  passive rom exercises Px meloxicam and robaxin PT ref done -eval and tx Will update if no improvement      Relevant Orders   Ambulatory referral to Physical Therapy    Other Visit Diagnoses    Need for influenza vaccination       Relevant Orders   Flu Vaccine QUAD 36+ mos IM (Completed)

## 2020-01-25 NOTE — Patient Instructions (Signed)
Let's refer you to PT for shoulder and neck pain  Continue current pillow  Instead of ibuprofen-try the meloxicam (with food)   Our office will call you to set that   For spasm/worse pain try the muscle relaxer with caution of sedation   (methocarbamol)

## 2020-01-27 ENCOUNTER — Encounter: Payer: Self-pay | Admitting: Family Medicine

## 2020-01-27 DIAGNOSIS — M542 Cervicalgia: Secondary | ICD-10-CM

## 2020-01-27 NOTE — Assessment & Plan Note (Signed)
Worse on L and in trapezius  Has had brief relief with chiropractic care  Ref made to PT for this and R shoulder pain  Px meloxicam and robaxin -prn Adv heat to relax muscles

## 2020-01-27 NOTE — Assessment & Plan Note (Signed)
Worse on R Consistent with tendonitis  Disc use of ice and passive rom exercises Px meloxicam and robaxin PT ref done -eval and tx Will update if no improvement

## 2020-01-29 NOTE — Telephone Encounter (Signed)
Order done for neck film

## 2020-02-11 ENCOUNTER — Encounter: Payer: Self-pay | Admitting: Family Medicine

## 2020-03-20 ENCOUNTER — Telehealth: Payer: Self-pay | Admitting: Family Medicine

## 2020-03-20 MED ORDER — AMPHETAMINE-DEXTROAMPHETAMINE 20 MG PO TABS: 20.0000 mg | ORAL_TABLET | Freq: Two times a day (BID) | ORAL | 0 refills | Status: DC

## 2020-03-20 NOTE — Telephone Encounter (Signed)
Pt called in needed a refill of her Adderall (20mg ) , she has 2 more pills   Preferred pharmacy: [address], [place], [place]

## 2020-04-24 ENCOUNTER — Ambulatory Visit: Payer: Medicaid Other | Admitting: Certified Nurse Midwife

## 2020-04-28 ENCOUNTER — Ambulatory Visit (INDEPENDENT_AMBULATORY_CARE_PROVIDER_SITE_OTHER): Payer: Medicaid Other | Admitting: Certified Nurse Midwife

## 2020-04-28 ENCOUNTER — Encounter: Payer: Self-pay | Admitting: Certified Nurse Midwife

## 2020-04-28 ENCOUNTER — Other Ambulatory Visit: Payer: Self-pay

## 2020-04-28 VITALS — BP 88/87 | HR 66 | Ht 63.0 in | Wt 132.2 lb

## 2020-04-28 DIAGNOSIS — Z319 Encounter for procreative management, unspecified: Secondary | ICD-10-CM

## 2020-04-28 DIAGNOSIS — Z30432 Encounter for removal of intrauterine contraceptive device: Secondary | ICD-10-CM | POA: Diagnosis not present

## 2020-04-28 NOTE — Progress Notes (Signed)
Pt desires IUD removal. Desires pregnancy.

## 2020-04-28 NOTE — Progress Notes (Signed)
Ariel Ortega is a 24 y.o. year old [id number] Caucasian female who presents for removal of a Mirena IUD, desires pregnancy. Her Mirena IUD was placed 04/03/2018.   BP (!) 88/87    Pulse 66    Ht 5\' 3"  (1.6 m)    Wt 132 lb 3.2 oz (60 kg)    BMI 23.42 kg/m   Time out was performed.  A small plastic speculum was placed in the vagina.  The cervix was visualized, and the strings were visible. They were grasped and the Mirena was easily removed intact without complications.   Samples of prenatal vitamins provided.   RTC with positive pregnancy test or sooner if needed.    , CNM [facility], [facility] 04/28/20 4:16 PM

## 2020-04-28 NOTE — Patient Instructions (Signed)
Preparing for Pregnancy °If you are considering becoming pregnant, make an appointment to see your regular health care provider to learn how to prepare for a safe and healthy pregnancy (preconception care). During a preconception care visit, your health care provider will: °· Do a complete physical exam, including a Pap test. °· Take a complete medical history. °· Give you information, answer your questions, and help you resolve problems. °Preconception checklist °Medical history °· Tell your health care provider about any current or past medical conditions. Your pregnancy or your ability to become pregnant may be affected by chronic conditions, such as diabetes, chronic hypertension, and thyroid problems. °· Include your family's medical history as well as your partner's medical history. °· Tell your health care provider about any history of STIs (sexually transmitted infections). These can affect your pregnancy. In some cases, they can be passed to your baby. Discuss any concerns that you have about STIs. °· If indicated, discuss the benefits of genetic testing. This testing will show whether there are any genetic conditions that may be passed from you or your partner to your baby. °· Tell your health care provider about: °? Any problems you have had with conception or pregnancy. °? Any medicines you take. These include vitamins, herbal supplements, and over-the-counter medicines. °? Your history of immunizations. Discuss any vaccinations that you may need. °Diet °· Ask your health care provider what to include in a healthy diet that has a balance of nutrients. This is especially important when you are pregnant or preparing to become pregnant. °· Ask your health care provider to help you reach a healthy weight before pregnancy. °? If you are overweight, you may be at higher risk for certain complications, such as high blood pressure, diabetes, and preterm birth. °? If you are underweight, you are more likely to  have a baby who has a low birth weight. °Lifestyle, work, and home °· Let your health care provider know: °? About any lifestyle habits that you have, such as alcohol use, drug use, or smoking. °? About recreational activities that may put you at risk during pregnancy, such as downhill skiing and certain exercise programs. °? Tell your health care provider about any international travel, especially any travel to places with an active Zika virus outbreak. °? About harmful substances that you may be exposed to at work or at home. These include chemicals, pesticides, radiation, or even litter boxes. °? If you do not feel safe at home. °Mental health °· Tell your health care provider about: °? Any history of mental health conditions, including feelings of depression, sadness, or anxiety. °? Any medicines that you take for a mental health condition. These include herbs and supplements. °Home instructions to prepare for pregnancy °Lifestyle ° °· Eat a balanced diet. This includes fresh fruits and vegetables, whole grains, lean meats, low-fat dairy products, healthy fats, and foods that are high in fiber. Ask to meet with a nutritionist or registered dietitian for assistance with meal planning and goals. °· Get regular exercise. Try to be active for at least 30 minutes a day on most days of the week. Ask your health care provider which activities are safe during pregnancy. °· Do not use any products that contain nicotine or tobacco, such as cigarettes and e-cigarettes. If you need help quitting, ask your health care provider. °· Do not drink alcohol. °· Do not take illegal drugs. °· Maintain a healthy weight. Ask your health care provider what weight range is right for you. °General   instructions °· Keep an accurate record of your menstrual periods. This makes it easier for your health care provider to determine your baby's due date. °· Begin taking prenatal vitamins and folic acid supplements daily as directed by your  health care provider. °· Manage any chronic conditions, such as high blood pressure and diabetes, as told by your health care provider. This is important. °How do I know that I am pregnant? °You may be pregnant if you have been sexually active and you miss your period. Symptoms of early pregnancy include: °· Mild cramping. °· Very light vaginal bleeding (spotting). °· Feeling unusually tired. °· Nausea and vomiting (morning sickness). °If you have any of these symptoms and you suspect that you might be pregnant, you can take a home pregnancy test. These tests check for a hormone in your urine (human chorionic gonadotropin, or hCG). A woman's body begins to make this hormone during early pregnancy. These tests are very accurate. Wait until at least the first day after you miss your period to take one. If the test shows that you are pregnant (you get a positive result), call your health care provider to make an appointment for prenatal care. °What should I do if I become pregnant? ° °  ° °· Make an appointment with your health care provider as soon as you suspect you are pregnant. °· Do not use any products that contain nicotine, such as cigarettes, chewing tobacco, and e-cigarettes. If you need help quitting, ask your health care provider. °· Do not drink alcoholic beverages. Alcohol is related to a number of birth defects. °· Avoid toxic odors and chemicals. °· You may continue to have sexual intercourse if it does not cause pain or other problems, such as vaginal bleeding. °This information is not intended to replace advice given to you by your health care provider. Make sure you discuss any questions you have with your health care provider. °Document Revised: 05/05/2017 Document Reviewed: 11/23/2015 °Elsevier Patient Education © 2020 Elsevier Inc. ° °

## 2020-06-03 ENCOUNTER — Telehealth: Payer: Self-pay

## 2020-06-03 ENCOUNTER — Emergency Department
Admission: EM | Admit: 2020-06-03 | Discharge: 2020-06-03 | Discharge status: Against Medical Advice | Payer: Medicaid Other

## 2020-06-03 NOTE — Telephone Encounter (Signed)
Pt called in and stated that she found out she was pregnant.  Took home test Saturday,  LMP 04/28/2021. The pt called in and spoke to the on call nurse. The nurse stated that she call when we open to see if we can see her the pt stated that she started bleeding this morning a bright red, the pt stated she has some pain. I told the pt I will send a message to the nurse. please advise   Pt saw JML

## 2020-06-03 NOTE — ED Triage Notes (Signed)
Approx [redacted] weeks pregnant  Having some vaginal bleeding

## 2020-06-03 NOTE — Telephone Encounter (Signed)
Patient called in stating that she had placed a pad when she got off the phone with you and that she has since bled through the pad. Patient would like you to give her a call back whenever you get the chance. Could you please advise?

## 2020-06-03 NOTE — Telephone Encounter (Signed)
Voicemail message left for patient- if she starts to run a fever, the cramping becomes intense or the bleeding is heavy she may call the triage line or report to the ED. She may send a mychart message tomorrow to let us know how she is feeling.

## 2020-06-03 NOTE — Telephone Encounter (Signed)
Spoke with patient she reports her LMP was 04/27/20 making her [redacted]w[redacted]d. States she had a positive home pregnancy test. She states she has been having period like cramps and sharp pain on her right side. Has been spotting dark red blood. Denies recent intercourse. Advised to put clean pad on and monitor bleeding. Stay hydrated and send a mychart message in one hour. Patient was in agreement.

## 2020-06-05 ENCOUNTER — Ambulatory Visit (INDEPENDENT_AMBULATORY_CARE_PROVIDER_SITE_OTHER): Payer: 59 | Admitting: Certified Nurse Midwife

## 2020-06-05 ENCOUNTER — Other Ambulatory Visit: Payer: Self-pay

## 2020-06-05 ENCOUNTER — Encounter: Payer: Self-pay | Admitting: Certified Nurse Midwife

## 2020-06-05 VITALS — BP 95/55 | HR 67 | Ht 63.0 in | Wt 134.5 lb

## 2020-06-05 DIAGNOSIS — O209 Hemorrhage in early pregnancy, unspecified: Secondary | ICD-10-CM | POA: Diagnosis not present

## 2020-06-05 DIAGNOSIS — Z3202 Encounter for pregnancy test, result negative: Secondary | ICD-10-CM | POA: Diagnosis not present

## 2020-06-05 LAB — POCT URINE PREGNANCY: Preg Test, Ur: NEGATIVE

## 2020-06-05 NOTE — Progress Notes (Signed)
GYN ENCOUNTER NOTE  Subjective:       Ariel Ortega is a 25 y.o. G4P1001 female here for evaluation of vaginal bleeding in early bleeding.   Reports positive pregnancy test x two (2) on 05/31/2020 followed by period like vaginal bleeding and abdominal cramping on 06/03/2020 that has since resolved.   Denies difficulty breathing or respiratory distress, chest pain, abdominal pain, excessive vaginal bleeding, dysuria, and leg pain or swelling.    Gynecologic History  Patient's last menstrual period was 04/26/2020 (approximate).  Contraception: none  Last Pap: due.   Obstetric History  OB History  Gravida Para Term Preterm AB Living  1 1 1  0 0 1  SAB IAB Ectopic Multiple Live Births  0 0 0 0 1    # Outcome Date GA Lbr Len/2nd Weight Sex Delivery Anes PTL Lv  1 Term 02/04/18 [redacted]w[redacted]d / 00:28 6 lb 9.5 oz (2.99 kg) F Vag-Spont EPI  LIV    Past Medical History:  Diagnosis Date   Anxiety    Depression 04/19/2014   Mononucleosis 04/09/11    Past Surgical History:  Procedure Laterality Date   WISDOM TOOTH EXTRACTION      Current Outpatient Medications on File Prior to Visit  Medication Sig Dispense Refill   EPINEPHrine (EPIPEN) 0.3 mg/0.3 mL DEVI Inject 0.3 mLs (0.3 mg total) into the muscle once. (Patient taking differently: Inject 0.3 mg into the muscle as needed.) 2 Device 1   No current facility-administered medications on file prior to visit.    Allergies  Allergen Reactions   Effexor [Venlafaxine] Nausea And Vomiting   Milk-Related Compounds Nausea And Vomiting    Lactose intolerance    Social History   Socioeconomic History   Marital status: Married    Spouse name: Not on file   Number of children: Not on file   Years of education: Not on file   Highest education level: Not on file  Occupational History   Not on file  Tobacco Use   Smoking status: Never Smoker   Smokeless tobacco: Never Used  Vaping Use   Vaping Use: Never used   Substance and Sexual Activity   Alcohol use: No    Alcohol/week: 0.0 standard drinks   Drug use: No   Sexual activity: Yes    Birth control/protection: I.U.D.  Other Topics Concern   Not on file  Social History Narrative       Social Determinants of Health   Financial Resource Strain: Not on file  Food Insecurity: Not on file  Transportation Needs: Not on file  Physical Activity: Not on file  Stress: Not on file  Social Connections: Not on file  Intimate Partner Violence: Not on file    Family History  Adopted: Yes  Problem Relation Age of Onset   Cancer - Prostate Father    Hyperlipidemia Father    Hypertension Father    Cancer Father        prostate cancer    Depression Mother    Anxiety disorder Mother    Breast cancer Neg Hx    Ovarian cancer Neg Hx    Colon cancer Neg Hx     The following portions of the patient's history were reviewed and updated as appropriate: allergies, current medications, past family history, past medical history, past social history, past surgical history and problem list.  Review of Systems  ROS negative except as noted above. Information obtained from patient.   Objective:   BP (!) 95/55  Pulse 67    Ht 5\' 3"  (1.6 m)    Wt 134 lb 8 oz (61 kg)    LMP 04/26/2020 (Approximate)    BMI 23.83 kg/m    CONSTITUTIONAL: Well-developed, well-nourished female in no acute distress.   PHYSICAL EXAM: Not indicated.   Recent Results (from the past 2160 hour(s))  POCT urine pregnancy     Status: None   Collection Time: 06/05/20  9:46 AM  Result Value Ref Range   Preg Test, Ur Negative Negative    Assessment:   1. Vaginal bleeding affecting early pregnancy  - POCT urine pregnancy - Beta hCG quant (ref lab)  2. Negative pregnancy test  - Beta hCG quant (ref lab)   Plan:   Informed of negative pregnancy test results. Patient tearful.   Labs: see orders.   Reviewed red flag symptoms and when to call.   RTC if  symptoms worsen or fail to improve.    06/07/20, CNM [facility], [facility]

## 2020-06-05 NOTE — Patient Instructions (Signed)
Miscarriage A miscarriage is the loss of a pregnancy before the 20th week of pregnancy. Sometimes, a pregnancy ends before a woman knows that she is pregnant. If you lose a pregnancy, talk with your doctor about:  Questions you have about the loss of your baby.  How to work through your grief.  Plans for future pregnancy. What are the causes? Many times, the cause of this condition is not known. What increases the risk? These things may make a pregnant woman more likely to lose a pregnancy: Certain health conditions  Conditions that affect hormones, such as: ? Thyroid disease. ? Polycystic ovary syndrome.  Diabetes.  A disease that causes the body's disease-fighting system to attack itself by mistake.  Infections.  Bleeding problems.  Being very overweight. Lifestyle factors  Using products that have tobacco or nicotine in them.  Being around tobacco smoke.  Having alcohol.  Having a lot of caffeine.  Using drugs. Problems with reproductive organs or parts  Having a cervix that opens and thins before your due date. The cervix is the lowest part of your womb.  Having Asherman syndrome, which leads to: ? Scars in the womb. ? The womb being abnormal in shape.  Growths (fibroids) in the womb.  Problems in the body that are present at birth.  Infection of the cervix or womb. Personal or health history  Injury.  Having lost a pregnancy before.  Being younger than age 18 or older than age 35.  Being around a harmful substance, such as radiation.  Having lead or other heavy metals in: ? Things you eat or drink. ? The air around you.  Using certain medicines. What are the signs or symptoms?  Blood or spots of blood coming from the vagina. You may also have cramps or pain.  Pain or cramps in the belly or low back.  Fluid or tissue coming out of the vagina. How is this treated? Sometimes, treatment is not needed. If you need treatment, you may be  treated with:  A procedure to open the cervix more and take tissue out of the womb.  Medicines. You may get a shot of medicine called Rho(D) immune globulin. Follow these instructions at home: Medicines  Take over-the-counter and prescription medicines only as told by your doctor.  If you were prescribed antibiotic medicine, take it as told by your doctor. Do not stop taking it even if you start to feel better. Activity  Rest as told by your doctor. Ask your doctor what activities are safe for you.  Have someone help you at home during this time. General instructions  Watch how much tissue comes out of the vagina.  Watch the size of any blood clots that come out of the vagina.  Do not have sex or douche until your doctor says it is okay.  Do not put things, such as tampons, in your vagina until your doctor says it is okay.  To help you and your partner with grieving: ? Talk with your doctor. ? See a counselor.  When you are ready, talk with your doctor about: ? Things to do for your health. ? How you can be healthy if you get pregnant again.  Keep all follow-up visits.   Where to find more information  The American College of Obstetricians and Gynecologists: acog.org  [place] Department of Health and Human Services Office of Women's Health: hrsa.gov/office-womens-health Contact a doctor if:  You have a fever or chills.  There is bad-smelling fluid coming   from the vagina.  You have more bleeding.  Tissue or clots of blood come out of your vagina. Get help right away if:  You have very bad cramps or pain in your back or belly.  You soak more than 2 large pads in an hour for more than 2 hours.  You get light-headed or weak.  You faint.  You feel sad, and you have sad thoughts a lot of the time.  You think about hurting yourself. Get help right awayif you feel like you may hurt yourself or others, or have thoughts about taking your own life. Go to your nearest  emergency room or:  Call your local emergency services (911 in the [place]  Call the National Suicide Prevention Lifeline at 1-800-273-8255. This is open 24 hours a day.  Text the Crisis Text Line at 741741. Summary  A miscarriage is the loss of a pregnancy before the 20th week of pregnancy. Sometimes, a pregnancy ends before a woman knows that she is pregnant.  Follow instructions from your doctor about medicines and activity.  To help you and your partner with grieving, talk with your doctor or a counselor.  Keep all follow-up visits. This information is not intended to replace advice given to you by your health care provider. Make sure you discuss any questions you have with your health care provider. Document Revised: 11/02/2019 Document Reviewed: 11/02/2019 Elsevier Patient Education  2021 Elsevier Inc.  

## 2020-06-06 LAB — BETA HCG QUANT (REF LAB): hCG Quant: <1 m[IU]/mL

## 2020-10-20 ENCOUNTER — Telehealth: Payer: Self-pay | Admitting: *Deleted

## 2020-10-20 NOTE — Telephone Encounter (Signed)
Spoke to patient by telephone and was advised that someone is driving her now to [facility] in [place]. Advised patient to keep Dr. Milinda Antis updated.

## 2020-10-20 NOTE — Telephone Encounter (Signed)
Aware, will watch for correspondence  

## 2020-10-20 NOTE — Telephone Encounter (Signed)
PLEASE NOTE: All timestamps contained within this report are represented as [place] Standard Time. CONFIDENTIALTY NOTICE: This fax transmission is intended only for the addressee. It contains information that is legally privileged, confidential or otherwise protected from use or disclosure. If you are not the intended recipient, you are strictly prohibited from reviewing, disclosing, copying using or disseminating any of this information or taking any action in reliance on or regarding this information. If you have received this fax in error, please notify us immediately by telephone so that we can arrange for its return to [place]. Phone: [phone], Toll-Free: [phone], Fax: [phone] Page: 1 of 2 Call Id: [id number] [facility] Day - Client TELEPHONE ADVICE RECORD AccessNurse Patient Name: Ariel Ortega Gender: Female DOB: 1996-02-07 Age: 25 Y 8 M 29 D Return Phone Number: [phone] (Primary) Address: [place]/ [place]  [address] Client [facility] Day - Client Client Site  [facility] [place] - Day Physician [place], Idamae Schuller - MD Contact Type Call Who Is Calling Patient / Member / Family / Caregiver Call Type Triage / Clinical Relationship To Patient Self Return Phone Number [phone] (Primary) Chief Complaint Dizziness Reason for Call Symptomatic / Request for Health Information Initial Comment Caller has some dizziness with vertigo. She is really fatigued and out of it. She has allergies. GOTO Facility Not Listed [facility] Translation No Nurse Assessment Nurse: Michell Heinrich, RN, Lanora Manis Date/Time (Eastern Time): 10/20/2020 3:23:29 PM Confirm and document reason for call. If symptomatic, describe symptoms. ---Caller states that she is having dizziness and vertigo. States that she is fatigued. States that it is usually her allergies. Does the patient have any new or worsening symptoms?  ---Yes Will a triage be completed? ---Yes Related visit to physician within the last 2 weeks? ---No Does the PT have any chronic conditions? (i.e. diabetes, asthma, this includes High risk factors for pregnancy, etc.) ---No Is the patient pregnant or possibly pregnant? (Ask all females between the ages of 4-55) ---No Is this a behavioral health or substance abuse call? ---No Guidelines Guideline Title Affirmed Question Affirmed Notes Nurse Date/Time (Eastern Time) Dizziness - Vertigo Loss of vision or double vision (Exception: similar to previous migraines) Cantrell, RN, Lanora Manis 10/20/2020 3:25:08 PM Disp. Time Lamount Cohen Time) Disposition Final User 10/20/2020 3:28:02 PM Go to ED Now (or PCP triage) Yes Cantrell, RN, Army Melia NOTE: All timestamps contained within this report are represented as [place] Standard Time. CONFIDENTIALTY NOTICE: This fax transmission is intended only for the addressee. It contains information that is legally privileged, confidential or otherwise protected from use or disclosure. If you are not the intended recipient, you are strictly prohibited from reviewing, disclosing, copying using or disseminating any of this information or taking any action in reliance on or regarding this information. If you have received this fax in error, please notify us immediately by telephone so that we can arrange for its return to [place]. Phone: [phone], Toll-Free: [phone], Fax: [phone] Page: 2 of 2 Call Id: [id number] Caller Disagree/Comply Comply Caller Understands Yes PreDisposition Call Doctor Care Advice Given Per Guideline GO TO ED NOW (OR PCP TRIAGE): * IF NO PCP (PRIMARY CARE PROVIDER) SECOND-LEVEL TRIAGE: You need to be seen within the next hour. Go to the ED/UCC at _____________ Hospital. Leave as soon as you can. ANOTHER ADULT SHOULD DRIVE: * It is better and safer if another adult drives instead of you. BRING MEDICINES: * Bring a list of your  current medicines when you  go to the Emergency Department (ER). * Bring the pill bottles too. This will help the doctor (or NP/PA) to make certain you are taking the right medicines and the right dose. CARE ADVICE given per Dizziness - Vertigo (Adult) guideline. Referrals GO TO FACILITY OTHER - SPECIFY

## 2020-10-21 ENCOUNTER — Encounter: Payer: Self-pay | Admitting: Family Medicine

## 2020-12-08 ENCOUNTER — Ambulatory Visit (INDEPENDENT_AMBULATORY_CARE_PROVIDER_SITE_OTHER): Payer: 59 | Admitting: Family Medicine

## 2020-12-08 ENCOUNTER — Encounter: Payer: Self-pay | Admitting: Family Medicine

## 2020-12-08 ENCOUNTER — Other Ambulatory Visit: Payer: Self-pay

## 2020-12-08 VITALS — BP 124/62 | HR 79 | Temp 98.1°F | Ht 63.25 in | Wt 137.3 lb

## 2020-12-08 DIAGNOSIS — F900 Attention-deficit hyperactivity disorder, predominantly inattentive type: Secondary | ICD-10-CM | POA: Diagnosis not present

## 2020-12-08 MED ORDER — LISDEXAMFETAMINE DIMESYLATE 20 MG PO CAPS: 20.0000 mg | ORAL_CAPSULE | Freq: Every day | ORAL | 0 refills | Status: DC

## 2020-12-08 NOTE — Patient Instructions (Signed)
Start vyvanse  If any intolerable side effects let us know   After the month if you want to go up on the dose let us know  Be careful with caffeine  Keep exercising  Keep organized   Take care of yourself

## 2020-12-08 NOTE — Assessment & Plan Note (Signed)
adderall 20 mg helped but caused unusual side eff of shoulder/neck muscle pain  Will try vyvanse 20 mg and update  Titrate up if helpful if needed  Doing well mood wise Good coping skills and organization

## 2020-12-08 NOTE — Progress Notes (Signed)
Subjective:    Patient ID: Ariel Ortega, female    DOB: 04/22/1996, 25 y.o.   MRN: [id number]  This visit occurred during the SARS-CoV-2 public health emergency.  Safety protocols were in place, including screening questions prior to the visit, additional usage of staff PPE, and extensive cleaning of exam room while observing appropriate contact time as indicated for disinfecting solutions.   HPI Pt presents to discuss ADHD management   Wt Readings from Last 3 Encounters:  12/08/20 137 lb 5 oz (62.3 kg)  06/05/20 134 lb 8 oz (61 kg)  04/28/20 132 lb 3.2 oz (60 kg)   24.13 kg/m   She has mood issues as well as ADD H/o GAD and depression   Px adderall 10 mg bid to start and then in c to 20 mg bid  It looks like she last filled this on Mar 20, 2020  She thought it caused her shoulder and neck pain  Stopped it and the pain stopped   No other side effects  Noted some initial change in appetite and sleep but got used to it quickly  No headache    It really helped her symptoms  She had a better life/not overwhelmed and felt better   Stress level is kind of high- a lot going on  Day to day stressors/ financial etc  Staying home with child right now   Patient Active Problem List   Diagnosis Date Noted   Neck pain 01/25/2020   Shoulder pain 01/25/2020   Hand paresthesia 12/14/2019   ADHD (attention deficit hyperactivity disorder), inattentive type 10/22/2019   Tinea corporis 06/28/2019   History of drug overdose 05/08/2014   Panic disorder 04/22/2014   History of depression 04/19/2014   GAD (generalized anxiety disorder) 12/25/2013   Panic attacks 12/25/2013   Wart 11/06/2012   Food allergy 10/02/2012   Past Medical History:  Diagnosis Date   Anxiety    Depression 04/19/2014   Mononucleosis 04/09/11   Past Surgical History:  Procedure Laterality Date   WISDOM TOOTH EXTRACTION     Social History   Tobacco Use   Smoking status: Never   Smokeless tobacco: Never   Vaping Use   Vaping Use: Never used  Substance Use Topics   Alcohol use: No    Alcohol/week: 0.0 standard drinks   Drug use: No   Family History  Adopted: Yes  Problem Relation Age of Onset   Cancer - Prostate Father    Hyperlipidemia Father    Hypertension Father    Cancer Father        prostate cancer    Depression Mother    Anxiety disorder Mother    Breast cancer Neg Hx    Ovarian cancer Neg Hx    Colon cancer Neg Hx    Allergies  Allergen Reactions   Adderall [Amphetamine-Dextroamphetamine]     Shoulder and neck pain    Effexor [Venlafaxine] Nausea And Vomiting   Milk-Related Compounds Nausea And Vomiting    Lactose intolerance   Current Outpatient Medications on File Prior to Visit  Medication Sig Dispense Refill   EPINEPHrine (EPIPEN) 0.3 mg/0.3 mL DEVI Inject 0.3 mLs (0.3 mg total) into the muscle once. (Patient taking differently: Inject 0.3 mg into the muscle as needed.) 2 Device 1   No current facility-administered medications on file prior to visit.    Review of Systems  Constitutional:  Negative for activity change, appetite change, fatigue, fever and unexpected weight change.  HENT:  Negative  for congestion, ear pain, rhinorrhea, sinus pressure and sore throat.   Eyes:  Negative for pain, redness and visual disturbance.  Respiratory:  Negative for cough, shortness of breath and wheezing.   Cardiovascular:  Negative for chest pain and palpitations.  Gastrointestinal:  Negative for abdominal pain, blood in stool, constipation and diarrhea.  Endocrine: Negative for polydipsia and polyuria.  Genitourinary:  Negative for dysuria, frequency and urgency.  Musculoskeletal:  Negative for arthralgias, back pain and myalgias.  Skin:  Negative for pallor and rash.  Allergic/Immunologic: Negative for environmental allergies.  Neurological:  Negative for dizziness, syncope and headaches.  Hematological:  Negative for adenopathy. Does not bruise/bleed easily.   Psychiatric/Behavioral:  Positive for decreased concentration. Negative for confusion and dysphoric mood. The patient is not nervous/anxious and is not hyperactive.       Objective:   Physical Exam Constitutional:      General: She is not in acute distress.    Appearance: Normal appearance. She is normal weight. She is not ill-appearing.  Eyes:     General: No scleral icterus.    Conjunctiva/sclera: Conjunctivae normal.     Pupils: Pupils are equal, round, and reactive to light.  Cardiovascular:     Rate and Rhythm: Normal rate and regular rhythm.     Pulses: Normal pulses.     Heart sounds: Normal heart sounds.  Pulmonary:     Effort: Pulmonary effort is normal. No respiratory distress.     Breath sounds: Normal breath sounds. No wheezing or rales.  Musculoskeletal:     Cervical back: Normal range of motion and neck supple. No rigidity or tenderness.  Skin:    Coloration: Skin is not pale.     Findings: No erythema.  Neurological:     Mental Status: She is alert.     Cranial Nerves: No cranial nerve deficit.     Coordination: Coordination normal.     Deep Tendon Reflexes: Reflexes normal.     Comments: No tremor  Psychiatric:        Mood and Affect: Mood normal.          Assessment & Plan:   Problem List Items Addressed This Visit       Other   ADHD (attention deficit hyperactivity disorder), inattentive type - Primary    adderall 20 mg helped but caused unusual side eff of shoulder/neck muscle pain  Will try vyvanse 20 mg and update  Titrate up if helpful if needed  Doing well mood wise Good coping skills and organization

## 2020-12-15 ENCOUNTER — Encounter: Payer: Self-pay | Admitting: Family Medicine

## 2020-12-17 MED ORDER — LISDEXAMFETAMINE DIMESYLATE 10 MG PO CAPS: 10.0000 mg | ORAL_CAPSULE | Freq: Every day | ORAL | 0 refills | Status: DC

## 2021-01-21 ENCOUNTER — Encounter: Payer: Self-pay | Admitting: Family Medicine

## 2021-01-21 ENCOUNTER — Ambulatory Visit (INDEPENDENT_AMBULATORY_CARE_PROVIDER_SITE_OTHER): Payer: 59 | Admitting: Family Medicine

## 2021-01-21 ENCOUNTER — Ambulatory Visit: Payer: 59 | Admitting: Family Medicine

## 2021-01-21 ENCOUNTER — Other Ambulatory Visit: Payer: Self-pay

## 2021-01-21 VITALS — BP 104/62 | HR 90 | Temp 98.1°F | Ht 63.25 in | Wt 131.1 lb

## 2021-01-21 DIAGNOSIS — R599 Enlarged lymph nodes, unspecified: Secondary | ICD-10-CM

## 2021-01-21 DIAGNOSIS — Z23 Encounter for immunization: Secondary | ICD-10-CM | POA: Diagnosis not present

## 2021-01-21 DIAGNOSIS — F900 Attention-deficit hyperactivity disorder, predominantly inattentive type: Secondary | ICD-10-CM

## 2021-01-21 MED ORDER — METHYLPHENIDATE HCL ER (OSM) 18 MG PO TBCR: 18.0000 mg | EXTENDED_RELEASE_TABLET | Freq: Every day | ORAL | 0 refills | Status: DC

## 2021-01-21 NOTE — Telephone Encounter (Signed)
Printed list out and placed in your inbox for review

## 2021-01-21 NOTE — Progress Notes (Signed)
Subjective:    Patient ID: Ariel Ortega, female    DOB: Jan 16, 1996, 25 y.o.   MRN: [id number]  This visit occurred during the SARS-CoV-2 public health emergency.  Safety protocols were in place, including screening questions prior to the visit, additional usage of staff PPE, and extensive cleaning of exam room while observing appropriate contact time as indicated for disinfecting solutions.   HPI Pt presents for c/o swollen LN in her neck   Also medication questions   Wt Readings from Last 3 Encounters:  01/21/21 131 lb 2 oz (59.5 kg)  12/08/20 137 lb 5 oz (62.3 kg)  06/05/20 134 lb 8 oz (61 kg)   23.04 kg/m  Flu shot given today   Several days ago -woke up with a swollen LN in L neck  Was a little tender  Went away after 3 days  No wounds/no problems No ear pain  Some allergies  No fever-felt fine   No dental problems or procedures   No other nodes Improved now   ADD: Was taking vyvanse-gave her headaches  She started taking left over adderall to get through the month  Took 10 mg and it works well but after a month her wrist has started to flare up  Thinks after she takes that medication- something hurts or gets sore   Appetite is ok  No GI issues   Is struggling with ADD when she does not take anything  She gets emotional also /anxiety gets worse   Patient Active Problem List   Diagnosis Date Noted   Swollen lymph nodes 01/21/2021   Neck pain 01/25/2020   Shoulder pain 01/25/2020   Hand paresthesia 12/14/2019   ADHD (attention deficit hyperactivity disorder), inattentive type 10/22/2019   Tinea corporis 06/28/2019   History of drug overdose 05/08/2014   Panic disorder 04/22/2014   History of depression 04/19/2014   GAD (generalized anxiety disorder) 12/25/2013   Panic attacks 12/25/2013   Wart 11/06/2012   Food allergy 10/02/2012   Past Medical History:  Diagnosis Date   Anxiety    Depression 04/19/2014   Mononucleosis 04/09/11   Past Surgical  History:  Procedure Laterality Date   WISDOM TOOTH EXTRACTION     Social History   Tobacco Use   Smoking status: Never   Smokeless tobacco: Never  Vaping Use   Vaping Use: Never used  Substance Use Topics   Alcohol use: No    Alcohol/week: 0.0 standard drinks   Drug use: No   Family History  Adopted: Yes  Problem Relation Age of Onset   Cancer - Prostate Father    Hyperlipidemia Father    Hypertension Father    Cancer Father        prostate cancer    Depression Mother    Anxiety disorder Mother    Breast cancer Neg Hx    Ovarian cancer Neg Hx    Colon cancer Neg Hx    Allergies  Allergen Reactions   Adderall [Amphetamine-Dextroamphetamine]     Shoulder and neck pain    Effexor [Venlafaxine] Nausea And Vomiting   Milk-Related Compounds Nausea And Vomiting    Lactose intolerance   Current Outpatient Medications on File Prior to Visit  Medication Sig Dispense Refill   EPINEPHrine (EPIPEN) 0.3 mg/0.3 mL DEVI Inject 0.3 mLs (0.3 mg total) into the muscle once. (Patient taking differently: Inject 0.3 mg into the muscle as needed.) 2 Device 1   lisdexamfetamine (VYVANSE) 10 MG capsule Take 1 capsule (10 mg  total) by mouth daily. (Patient not taking: Reported on 01/21/2021) 30 capsule 0   No current facility-administered medications on file prior to visit.     Review of Systems  Constitutional:  Negative for activity change, appetite change, fatigue, fever and unexpected weight change.  HENT:  Negative for congestion, ear pain, rhinorrhea, sinus pressure and sore throat.   Eyes:  Negative for pain, redness and visual disturbance.  Respiratory:  Negative for cough, shortness of breath and wheezing.   Cardiovascular:  Negative for chest pain and palpitations.  Gastrointestinal:  Negative for abdominal pain, blood in stool, constipation and diarrhea.  Endocrine: Negative for polydipsia and polyuria.  Genitourinary:  Negative for dysuria, frequency and urgency.   Musculoskeletal:  Negative for arthralgias, back pain and myalgias.  Skin:  Negative for pallor and rash.  Allergic/Immunologic: Positive for environmental allergies. Negative for food allergies.  Neurological:  Negative for dizziness, syncope and headaches.  Hematological:  Negative for adenopathy. Does not bruise/bleed easily.  Psychiatric/Behavioral:  Positive for decreased concentration. Negative for dysphoric mood. The patient is nervous/anxious.       Objective:   Physical Exam Constitutional:      General: She is not in acute distress.    Appearance: Normal appearance. She is normal weight. She is not ill-appearing or diaphoretic.  HENT:     Head: Normocephalic and atraumatic.     Right Ear: Tympanic membrane, ear canal and external ear normal. There is no impacted cerumen.     Left Ear: Tympanic membrane, ear canal and external ear normal. There is no impacted cerumen.     Nose: Nose normal. No congestion or rhinorrhea.     Mouth/Throat:     Mouth: Mucous membranes are moist.     Pharynx: Oropharynx is clear. No oropharyngeal exudate or posterior oropharyngeal erythema.  Eyes:     General: No scleral icterus.       Right eye: No discharge.        Left eye: No discharge.     Extraocular Movements: Extraocular movements intact.     Conjunctiva/sclera: Conjunctivae normal.     Pupils: Pupils are equal, round, and reactive to light.  Neck:     Vascular: No carotid bruit.  Cardiovascular:     Rate and Rhythm: Normal rate and regular rhythm.     Heart sounds: Normal heart sounds.  Pulmonary:     Effort: Pulmonary effort is normal. No respiratory distress.     Breath sounds: Normal breath sounds. No wheezing or rales.  Abdominal:     General: Abdomen is flat. Bowel sounds are normal. There is no distension.     Palpations: There is no mass.     Tenderness: There is no abdominal tenderness.     Hernia: No hernia is present.  Musculoskeletal:     Cervical back: Normal range  of motion and neck supple. No rigidity or tenderness.     Right lower leg: No edema.     Left lower leg: No edema.  Lymphadenopathy:     Head:     Right side of head: No submental, submandibular, tonsillar, preauricular, posterior auricular or occipital adenopathy.     Left side of head: Submandibular adenopathy present. No submental, tonsillar, preauricular, posterior auricular or occipital adenopathy.     Cervical: No cervical adenopathy.     Right cervical: No superficial, deep or posterior cervical adenopathy.    Left cervical: No superficial, deep or posterior cervical adenopathy.     Upper Body:  Right upper body: No supraclavicular, axillary, pectoral or epitrochlear adenopathy.     Left upper body: No supraclavicular, axillary, pectoral or epitrochlear adenopathy.     Lower Body: No right inguinal adenopathy. No left inguinal adenopathy.     Comments: One shotty submandibular LN noted on L  Mobile and nontender   Skin:    General: Skin is warm and dry.     Coloration: Skin is not jaundiced or pale.     Findings: No bruising, erythema, lesion or rash.  Neurological:     Mental Status: She is alert.     Cranial Nerves: No cranial nerve deficit.  Psychiatric:        Mood and Affect: Mood normal.          Assessment & Plan:   Problem List Items Addressed This Visit       Immune and Lymphatic   Swollen lymph nodes - Primary    Solitary lymph node - L submandibular  Was enlarged and sore and now much better Shotty LN noted on exam -mobile and non tender No known cause (is around cats and discussed cat scratch disease)  Will watch for wounds, respiratory issues, ear problems or dental problems Will continue to monitor -if larger or does not improve in the next mo inst to let us know         Other   ADHD (attention deficit hyperactivity disorder), inattentive type    adderall works but causes aches and pains  vyvanse caused headaches When not treated- ADD causes  anxiety  Disc options inst to check with ins and get a list of covered alternatives and then we will pick one from there  Enc her to keep up good organizational habits       Other Visit Diagnoses     Need for influenza vaccination       Relevant Orders   Flu Vaccine QUAD 6+ mos PF IM (Fluarix Quad PF) (Completed)

## 2021-01-21 NOTE — Assessment & Plan Note (Signed)
Solitary lymph node - L submandibular  Was enlarged and sore and now much better Shotty LN noted on exam -mobile and non tender No known cause (is around cats and discussed cat scratch disease)  Will watch for wounds, respiratory issues, ear problems or dental problems Will continue to monitor -if larger or does not improve in the next mo inst to let us know

## 2021-01-21 NOTE — Assessment & Plan Note (Signed)
adderall works but causes aches and pains  vyvanse caused headaches When not treated- ADD causes anxiety  Disc options inst to check with ins and get a list of covered alternatives and then we will pick one from there  Enc her to keep up good organizational habits

## 2021-01-21 NOTE — Patient Instructions (Addendum)
Call your insurance co and get a list of ADD medications that are covered   The lymph node seems smaller-keep an eye on it If it gets bigger or sore again let me know   Watch out for dental issues- get an exam when it is time

## 2021-01-28 ENCOUNTER — Encounter: Payer: Self-pay | Admitting: Family Medicine

## 2021-02-03 ENCOUNTER — Ambulatory Visit: Payer: 59 | Admitting: Family Medicine

## 2021-02-23 MED ORDER — METHYLPHENIDATE HCL ER (OSM) 18 MG PO TBCR: 18.0000 mg | EXTENDED_RELEASE_TABLET | Freq: Every day | ORAL | 0 refills | Status: DC

## 2021-04-02 ENCOUNTER — Other Ambulatory Visit: Payer: Self-pay | Admitting: Family Medicine

## 2021-04-03 MED ORDER — METHYLPHENIDATE HCL ER (OSM) 18 MG PO TBCR: 18.0000 mg | EXTENDED_RELEASE_TABLET | Freq: Every day | ORAL | 0 refills | Status: DC

## 2021-04-03 NOTE — Telephone Encounter (Signed)
Name of Medication: Concerta Name of Pharmacy: [facility] Last Fill or Written Date and Quantity:02/23/21 #30 tabs with 0 refills  Last Office Visit and Type: lump in neck 01/21/21 (f/u: ADHD med on 12/08/20) Next Office Visit and Type: none scheduled

## 2021-04-05 ENCOUNTER — Encounter: Payer: Self-pay | Admitting: Family Medicine

## 2021-04-07 ENCOUNTER — Ambulatory Visit: Payer: 59 | Admitting: Family Medicine

## 2021-04-07 MED ORDER — METHYLPHENIDATE HCL ER (OSM) 18 MG PO TBCR: 18.0000 mg | EXTENDED_RELEASE_TABLET | Freq: Every day | ORAL | 0 refills | Status: DC

## 2021-04-07 NOTE — Telephone Encounter (Signed)
Called [facility] and cancelled Rx, routing message back to PCP

## 2021-04-07 NOTE — Telephone Encounter (Signed)
Pt called in would like RX methylphenidate (CONCERTA) 18 MG PO CR tablet be sent to [facility] in [place]

## 2021-05-08 ENCOUNTER — Other Ambulatory Visit: Payer: Self-pay | Admitting: Family Medicine

## 2021-05-11 MED ORDER — METHYLPHENIDATE HCL ER (OSM) 18 MG PO TBCR: 18.0000 mg | EXTENDED_RELEASE_TABLET | Freq: Every day | ORAL | 0 refills | Status: DC

## 2021-05-11 NOTE — Telephone Encounter (Signed)
Name of Medication: Concerta Name of Pharmacy: [facility] [address] Last Fill or Written Date and Quantity: 04/07/21 #30 tabs/ 0 refills Last Office Visit and Type: Swollen Lymph nodes on 01/21/21 Next Office Visit and Type: none scheduled  Last Controlled Substance Agreement Date: n/a Last UDS:n/a

## 2021-05-14 ENCOUNTER — Encounter: Payer: Self-pay | Admitting: Family Medicine

## 2021-05-14 MED ORDER — METHYLPHENIDATE HCL ER (OSM) 18 MG PO TBCR: 18.0000 mg | EXTENDED_RELEASE_TABLET | Freq: Every day | ORAL | 0 refills | Status: DC

## 2021-05-14 NOTE — Telephone Encounter (Signed)
Please cancel the px at [facility] I sent to [facility]

## 2021-05-14 NOTE — Telephone Encounter (Signed)
Called [facility] and cancelled Rx

## 2021-07-09 ENCOUNTER — Ambulatory Visit: Payer: Self-pay | Admitting: Family Medicine

## 2021-07-17 ENCOUNTER — Ambulatory Visit: Payer: Medicaid Other | Admitting: Family Medicine

## 2021-07-22 ENCOUNTER — Other Ambulatory Visit: Payer: Self-pay

## 2021-07-22 ENCOUNTER — Ambulatory Visit (INDEPENDENT_AMBULATORY_CARE_PROVIDER_SITE_OTHER): Payer: Medicaid Other | Admitting: Family Medicine

## 2021-07-22 ENCOUNTER — Encounter: Payer: Self-pay | Admitting: Family Medicine

## 2021-07-22 DIAGNOSIS — J301 Allergic rhinitis due to pollen: Secondary | ICD-10-CM

## 2021-07-22 DIAGNOSIS — J309 Allergic rhinitis, unspecified: Secondary | ICD-10-CM | POA: Insufficient documentation

## 2021-07-22 MED ORDER — TRIAMCINOLONE ACETONIDE 55 MCG/ACT NA AERO: 2.0000 | INHALATION_SPRAY | Freq: Every day | NASAL | 12 refills | Status: DC

## 2021-07-22 MED ORDER — CETIRIZINE HCL 10 MG PO TABS: 10.0000 mg | ORAL_TABLET | Freq: Every day | ORAL | 11 refills | Status: DC

## 2021-07-22 NOTE — Assessment & Plan Note (Signed)
With congestion/rhinorrhea and etd with vertigo  ?Seasonal spring through fall  ?Replace claritin with zyrtec ?Trial of nasacort (does not tolerate flonase) ?Allergen avoidance ?Mask to work outdoors in spring  ?Handout given  ?Update if not starting to improve in a week or if worsening   ?

## 2021-07-22 NOTE — Progress Notes (Signed)
? ?Subjective:  ? ? Patient ID: Ariel Ortega, female    DOB: 1995/09/02, 26 y.o.   MRN: [id number] ? ?This visit occurred during the SARS-CoV-2 public health emergency.  Safety protocols were in place, including screening questions prior to the visit, additional usage of staff PPE, and extensive cleaning of exam room while observing appropriate contact time as indicated for disinfecting solutions.  ? ?HPI ?Pt presents with seasonal allergy symptoms  ? ? ? Spring is her worse season  ?Worse this year  ?Worked outside this weekend-made it much worse  ? ?Some vertigo on and off (like she is on a boat)  ?Tired and foggy in general  ? ?Stuffy and runny nose  ?Pnd ?Clears throat  ?Looses voice occ  ?Occ sneezing  ?Some cough in am  ?No wheeze ?No tighness or sob  ? ?Eczema on knees and elbows  ?No hives  ? ? ?Otc: ?Claritin -am ?Benadryl- at night  ? ?Used flonase in the past- makes her tired  ? ? ? ?She carries epi pen for food allergies  ?Has not needed in years  ? ?Patient Active Problem List  ? Diagnosis Date Noted  ? Allergic rhinitis 07/22/2021  ? Swollen lymph nodes 01/21/2021  ? Neck pain 01/25/2020  ? Shoulder pain 01/25/2020  ? Hand paresthesia 12/14/2019  ? ADHD (attention deficit hyperactivity disorder), inattentive type 10/22/2019  ? Tinea corporis 06/28/2019  ? History of drug overdose 05/08/2014  ? Panic disorder 04/22/2014  ? History of depression 04/19/2014  ? GAD (generalized anxiety disorder) 12/25/2013  ? Panic attacks 12/25/2013  ? Wart 11/06/2012  ? Food allergy 10/02/2012  ? ?Past Medical History:  ?Diagnosis Date  ? Anxiety   ? Depression 04/19/2014  ? Mononucleosis 04/09/11  ? ?Past Surgical History:  ?Procedure Laterality Date  ? WISDOM TOOTH EXTRACTION    ? ?Social History  ? ?Tobacco Use  ? Smoking status: Never  ? Smokeless tobacco: Never  ?Vaping Use  ? Vaping Use: Never used  ?Substance Use Topics  ? Alcohol use: No  ?  Alcohol/week: 0.0 standard drinks  ? Drug use: No  ? ?Family History   ?Adopted: Yes  ?Problem Relation Age of Onset  ? Cancer - Prostate Father   ? Hyperlipidemia Father   ? Hypertension Father   ? Cancer Father   ?     prostate cancer   ? Depression Mother   ? Anxiety disorder Mother   ? Breast cancer Neg Hx   ? Ovarian cancer Neg Hx   ? Colon cancer Neg Hx   ? ?Allergies  ?Allergen Reactions  ? Adderall [Amphetamine-Dextroamphetamine]   ?  Shoulder and neck pain   ? Effexor [Venlafaxine] Nausea And Vomiting  ? Milk-Related Compounds Nausea And Vomiting  ?  Lactose intolerance  ? ?Current Outpatient Medications on File Prior to Visit  ?Medication Sig Dispense Refill  ? EPINEPHrine (EPIPEN) 0.3 mg/0.3 mL DEVI Inject 0.3 mLs (0.3 mg total) into the muscle once. (Patient taking differently: Inject 0.3 mg into the muscle as needed.) 2 Device 1  ? methylphenidate (CONCERTA) 18 MG PO CR tablet Take 1 tablet (18 mg total) by mouth daily. 30 tablet 0  ? ?No current facility-administered medications on file prior to visit.  ?  ?Review of Systems  ?Constitutional:  Positive for fatigue. Negative for activity change, appetite change, fever and unexpected weight change.  ?HENT:  Positive for postnasal drip and rhinorrhea. Negative for congestion, ear pain, sinus pressure, sore throat, trouble  swallowing and voice change.   ?     Ear fullness and popping  ?  ?Eyes:  Negative for pain, redness and visual disturbance.  ?Respiratory:  Negative for cough, shortness of breath and wheezing.   ?Cardiovascular:  Negative for chest pain and palpitations.  ?Gastrointestinal:  Negative for abdominal pain, blood in stool, constipation and diarrhea.  ?Endocrine: Negative for polydipsia and polyuria.  ?Genitourinary:  Negative for dysuria, frequency and urgency.  ?Musculoskeletal:  Negative for arthralgias, back pain and myalgias.  ?Skin:  Negative for pallor and rash.  ?Allergic/Immunologic: Negative for environmental allergies.  ?Neurological:  Positive for dizziness. Negative for syncope and headaches.   ?Hematological:  Negative for adenopathy. Does not bruise/bleed easily.  ?Psychiatric/Behavioral:  Negative for decreased concentration and dysphoric mood. The patient is not nervous/anxious.   ? ?   ?Objective:  ? Physical Exam ?Constitutional:   ?   General: She is not in acute distress. ?   Appearance: Normal appearance. She is not ill-appearing.  ?HENT:  ?   Head: Normocephalic and atraumatic.  ?   Right Ear: Tympanic membrane, ear canal and external ear normal. There is no impacted cerumen.  ?   Left Ear: Tympanic membrane, ear canal and external ear normal. There is no impacted cerumen.  ?   Ears:  ?   Comments: TMs are slt dull ?   Nose: Rhinorrhea present.  ?   Comments: Boggy nares  ?Pale mucosa ?   Mouth/Throat:  ?   Mouth: Mucous membranes are moist.  ?   Comments: Clear pnd ?Cobblestoning in post throat  ?Eyes:  ?   General:     ?   Right eye: No discharge.     ?   Left eye: No discharge.  ?   Conjunctiva/sclera: Conjunctivae normal.  ?   Pupils: Pupils are equal, round, and reactive to light.  ?Cardiovascular:  ?   Rate and Rhythm: Normal rate and regular rhythm.  ?   Heart sounds: Normal heart sounds.  ?Pulmonary:  ?   Effort: Pulmonary effort is normal. No respiratory distress.  ?   Breath sounds: Normal breath sounds. No stridor. No wheezing, rhonchi or rales.  ?   Comments: No wheeze even on forced exp ?Musculoskeletal:  ?   Cervical back: Normal range of motion and neck supple.  ?Lymphadenopathy:  ?   Cervical: No cervical adenopathy.  ?Skin: ?   General: Skin is warm and dry.  ?   Coloration: Skin is not pale.  ?   Findings: No rash.  ?Neurological:  ?   Mental Status: She is alert.  ?   Cranial Nerves: No cranial nerve deficit.  ?Psychiatric:     ?   Mood and Affect: Mood normal.  ? ? ? ? ? ?   ?Assessment & Plan:  ? ?Problem List Items Addressed This Visit   ? ?  ? Respiratory  ? Allergic rhinitis  ?  With congestion/rhinorrhea and etd with vertigo  ?Seasonal spring through fall  ?Replace  claritin with zyrtec ?Trial of nasacort (does not tolerate flonase) ?Allergen avoidance ?Mask to work outdoors in spring  ?Handout given  ?Update if not starting to improve in a week or if worsening   ?  ?  ? ? ?

## 2021-07-22 NOTE — Patient Instructions (Signed)
Instead of claritin use zyrtec 10 mg each night  ? ?Add nasacort nasal spray daily as directed  ? ?I sent these in but you may have to get over the counter  ?If so - store brand is cheaper  ? ?Avoid pollen when you can  ?Wear a mask when working outside  ?

## 2021-07-28 ENCOUNTER — Other Ambulatory Visit: Payer: Self-pay | Admitting: Family Medicine

## 2021-07-29 MED ORDER — METHYLPHENIDATE HCL ER (OSM) 18 MG PO TBCR: 18.0000 mg | EXTENDED_RELEASE_TABLET | Freq: Every day | ORAL | 0 refills | Status: DC

## 2021-07-29 NOTE — Telephone Encounter (Signed)
Name of Medication: concerta 18 mg ?Name of Pharmacy: [facility] /per note pt request [facility] ?Last Fill or Written Date and Quantity: # 30 on 05/14/2021 ?Last Office Visit and Type: 07/22/2021 acut and 12/08/2020 ADHD ?Next Office Visit and Type: none scheduled ?Last Controlled Substance Agreement Date: none seen ?Last UDS:06/27/2017 ? ?Sending note to Dr Milinda Antis and would you consider changing [facility]; [facility] [address] is also on pts pharmacy profile. ? ? ?

## 2021-08-10 ENCOUNTER — Encounter: Payer: Self-pay | Admitting: Family Medicine

## 2021-08-10 MED ORDER — METHYLPHENIDATE HCL ER (OSM) 18 MG PO TBCR: 18.0000 mg | EXTENDED_RELEASE_TABLET | Freq: Every day | ORAL | 0 refills | Status: DC

## 2021-08-10 NOTE — Telephone Encounter (Signed)
Please cancel the previous concerta that was sent to [place] on 3/15 ?Thanks  ?

## 2021-08-10 NOTE — Telephone Encounter (Signed)
Left VM cancelling Rx at [facility]  ?

## 2021-08-20 ENCOUNTER — Ambulatory Visit: Payer: Medicaid Other | Admitting: Family Medicine

## 2021-08-26 ENCOUNTER — Ambulatory Visit (INDEPENDENT_AMBULATORY_CARE_PROVIDER_SITE_OTHER): Payer: Commercial Managed Care - PPO | Admitting: Family Medicine

## 2021-08-26 ENCOUNTER — Encounter: Payer: Self-pay | Admitting: Family Medicine

## 2021-08-26 VITALS — BP 110/68 | HR 83 | Temp 97.5°F | Ht 63.25 in | Wt 131.2 lb

## 2021-08-26 DIAGNOSIS — R5382 Chronic fatigue, unspecified: Secondary | ICD-10-CM | POA: Diagnosis not present

## 2021-08-26 DIAGNOSIS — N926 Irregular menstruation, unspecified: Secondary | ICD-10-CM

## 2021-08-26 DIAGNOSIS — R5383 Other fatigue: Secondary | ICD-10-CM | POA: Insufficient documentation

## 2021-08-26 LAB — CBC WITH DIFFERENTIAL/PLATELET
Basophils Absolute: 0 10*3/uL (ref 0.0–0.1)
Basophils Relative: 0.7 % (ref 0.0–3.0)
Eosinophils Absolute: 0.1 10*3/uL (ref 0.0–0.7)
Eosinophils Relative: 1.9 % (ref 0.0–5.0)
HCT: 39.9 % (ref 36.0–46.0)
Hemoglobin: 13.6 g/dL (ref 12.0–15.0)
Lymphocytes Relative: 46.3 % — ABNORMAL HIGH (ref 12.0–46.0)
Lymphs Abs: 2.2 10*3/uL (ref 0.7–4.0)
MCHC: 34 g/dL (ref 30.0–36.0)
MCV: 93.9 fl (ref 78.0–100.0)
Monocytes Absolute: 0.4 10*3/uL (ref 0.1–1.0)
Monocytes Relative: 7.8 % (ref 3.0–12.0)
Neutro Abs: 2 10*3/uL (ref 1.4–7.7)
Neutrophils Relative %: 43.3 % (ref 43.0–77.0)
Platelets: 237 10*3/uL (ref 150.0–400.0)
RBC: 4.26 Mil/uL (ref 3.87–5.11)
RDW: 13.2 % (ref 11.5–15.5)
WBC: 4.7 10*3/uL (ref 4.0–10.5)

## 2021-08-26 LAB — COMPREHENSIVE METABOLIC PANEL
ALT: 20 U/L (ref 0–35)
AST: 21 U/L (ref 0–37)
Albumin: 4.5 g/dL (ref 3.5–5.2)
Alkaline Phosphatase: 49 U/L (ref 39–117)
BUN: 8 mg/dL (ref 6–23)
CO2: 29 mEq/L (ref 19–32)
Calcium: 9.2 mg/dL (ref 8.4–10.5)
Chloride: 106 mEq/L (ref 96–112)
Creatinine, Ser: 0.68 mg/dL (ref 0.40–1.20)
GFR: 122.61 mL/min (ref >60.00)
Glucose, Bld: 84 mg/dL (ref 70–99)
Potassium: 4.1 mEq/L (ref 3.5–5.1)
Sodium: 140 mEq/L (ref 135–145)
Total Bilirubin: 0.6 mg/dL (ref 0.2–1.2)
Total Protein: 6.9 g/dL (ref 6.0–8.3)

## 2021-08-26 LAB — POCT URINE PREGNANCY: Preg Test, Ur: NEGATIVE

## 2021-08-26 LAB — TSH: TSH: 1.6 u[IU]/mL (ref 0.35–5.50)

## 2021-08-26 LAB — IRON: Iron: 95 ug/dL (ref 42–145)

## 2021-08-26 MED ORDER — NORETHINDRONE ACET-ETHINYL EST 1-20 MG-MCG PO TABS: 1.0000 | ORAL_TABLET | Freq: Every day | ORAL | 11 refills | Status: DC

## 2021-08-26 NOTE — Patient Instructions (Addendum)
Lab today  ?Urine pregnancy test today  ? ? ?Once tests are back I will send in an oral contraceptive  ?Take one pill daily/ around the same time ?Use condoms for STD prevention  ? ?If any intolerable side effects let us know  ? ?Follow up for gyn exam with pap  ? ? ? ? ? ?

## 2021-08-26 NOTE — Progress Notes (Signed)
? ?Subjective:  ? ? Patient ID: Ariel AsalMichelle Donaway, female    DOB: 10/13/1995, 26 y.o.   MRN: [id number] ? ?HPI ?Pt presents for irreg menses ? ?Wt Readings from Last 3 Encounters:  ?08/26/21 131 lb 4 oz (59.5 kg)  ?07/22/21 129 lb (58.5 kg)  ?01/21/21 131 lb 2 oz (59.5 kg)  ? ?23.07 kg/m? ? ?Irregular periods  (all over the place) ?Now has skipped one (last month)  ? ?Period became shorter ?Lasting 2 d  ?Very light  ? ?Is sexually active  ?Uses condoms ? ?Had mirena IUD last year (still had regular menses with those) ?Had 2 years and became uncomfortable and then had it removed  ?Has not been on the pill  ? ?She tried to get pregnant  ?Had some early miscarriages  ? ?Now not wanting pregnancy  ? ?Gyn :  [facility]  ?Unsure when her last pap was (no abn paps)  ?Has never had STD (denies need for screening)  ?Had one at least  ? ?No more stress than usual  ?More fatigued  ?Cold all the time  ? ?BP Readings from Last 3 Encounters:  ?08/26/21 110/68  ?07/22/21 116/72  ?01/21/21 104/62  ? ?Patient Active Problem List  ? Diagnosis Date Noted  ? Irregular menses 08/26/2021  ? Fatigue 08/26/2021  ? Allergic rhinitis 07/22/2021  ? Swollen lymph nodes 01/21/2021  ? Neck pain 01/25/2020  ? Shoulder pain 01/25/2020  ? Hand paresthesia 12/14/2019  ? ADHD (attention deficit hyperactivity disorder), inattentive type 10/22/2019  ? Tinea corporis 06/28/2019  ? History of drug overdose 05/08/2014  ? Panic disorder 04/22/2014  ? History of depression 04/19/2014  ? GAD (generalized anxiety disorder) 12/25/2013  ? Panic attacks 12/25/2013  ? Wart 11/06/2012  ? Food allergy 10/02/2012  ? ?Past Medical History:  ?Diagnosis Date  ? Anxiety   ? Depression 04/19/2014  ? Mononucleosis 04/09/11  ? ?Past Surgical History:  ?Procedure Laterality Date  ? WISDOM TOOTH EXTRACTION    ? ?Social History  ? ?Tobacco Use  ? Smoking status: Never  ? Smokeless tobacco: Never  ?Vaping Use  ? Vaping Use: Never used  ?Substance Use Topics  ? Alcohol  use: No  ?  Alcohol/week: 0.0 standard drinks  ? Drug use: No  ? ?Family History  ?Adopted: Yes  ?Problem Relation Age of Onset  ? Cancer - Prostate Father   ? Hyperlipidemia Father   ? Hypertension Father   ? Cancer Father   ?     prostate cancer   ? Depression Mother   ? Anxiety disorder Mother   ? Breast cancer Neg Hx   ? Ovarian cancer Neg Hx   ? Colon cancer Neg Hx   ? ?Allergies  ?Allergen Reactions  ? Adderall [Amphetamine-Dextroamphetamine]   ?  Shoulder and neck pain   ? Effexor [Venlafaxine] Nausea And Vomiting  ? Milk-Related Compounds Nausea And Vomiting  ?  Lactose intolerance  ? ?Current Outpatient Medications on File Prior to Visit  ?Medication Sig Dispense Refill  ? cetirizine (ZYRTEC) 10 MG tablet Take 1 tablet (10 mg total) by mouth daily. 30 tablet 11  ? EPINEPHrine (EPIPEN) 0.3 mg/0.3 mL DEVI Inject 0.3 mLs (0.3 mg total) into the muscle once. (Patient taking differently: Inject 0.3 mg into the muscle as needed.) 2 Device 1  ? methylphenidate (CONCERTA) 18 MG PO CR tablet Take 1 tablet (18 mg total) by mouth daily. 30 tablet 0  ? triamcinolone (NASACORT) 55 MCG/ACT AERO nasal  inhaler Place 2 sprays into the nose daily. 1 each 12  ? ?No current facility-administered medications on file prior to visit.  ?  ? ? ?Review of Systems  ?Constitutional:  Positive for fatigue. Negative for activity change, appetite change, fever and unexpected weight change.  ?HENT:  Negative for congestion, ear pain, rhinorrhea, sinus pressure and sore throat.   ?Eyes:  Negative for pain, redness and visual disturbance.  ?Respiratory:  Negative for cough, shortness of breath and wheezing.   ?Cardiovascular:  Negative for chest pain and palpitations.  ?Gastrointestinal:  Negative for abdominal pain, blood in stool, constipation and diarrhea.  ?Endocrine: Negative for polydipsia and polyuria.  ?Genitourinary:  Positive for menstrual problem. Negative for dysuria, frequency, pelvic pain, urgency, vaginal discharge and  vaginal pain.  ?Musculoskeletal:  Negative for arthralgias, back pain and myalgias.  ?Skin:  Negative for pallor and rash.  ?Allergic/Immunologic: Negative for environmental allergies.  ?Neurological:  Negative for dizziness, syncope and headaches.  ?Hematological:  Negative for adenopathy. Does not bruise/bleed easily.  ?Psychiatric/Behavioral:  Negative for decreased concentration and dysphoric mood. The patient is not nervous/anxious.   ? ?   ?Objective:  ? Physical Exam ?Constitutional:   ?   General: She is not in acute distress. ?   Appearance: Normal appearance. She is well-developed and normal weight. She is not ill-appearing or diaphoretic.  ?HENT:  ?   Head: Normocephalic and atraumatic.  ?Eyes:  ?   Conjunctiva/sclera: Conjunctivae normal.  ?   Pupils: Pupils are equal, round, and reactive to light.  ?Neck:  ?   Thyroid: No thyromegaly.  ?   Vascular: No carotid bruit or JVD.  ?Cardiovascular:  ?   Rate and Rhythm: Normal rate and regular rhythm.  ?   Heart sounds: Normal heart sounds.  ?  No gallop.  ?Pulmonary:  ?   Effort: Pulmonary effort is normal. No respiratory distress.  ?   Breath sounds: Normal breath sounds. No wheezing or rales.  ?Abdominal:  ?   General: There is no distension or abdominal bruit.  ?   Palpations: Abdomen is soft.  ?Musculoskeletal:  ?   Cervical back: Normal range of motion and neck supple.  ?   Right lower leg: No edema.  ?   Left lower leg: No edema.  ?Lymphadenopathy:  ?   Cervical: No cervical adenopathy.  ?Skin: ?   General: Skin is warm and dry.  ?   Coloration: Skin is not pale.  ?   Findings: No bruising, erythema or rash.  ?Neurological:  ?   Mental Status: She is alert.  ?   Coordination: Coordination normal.  ?   Deep Tendon Reflexes: Reflexes are normal and symmetric. Reflexes normal.  ?Psychiatric:     ?   Mood and Affect: Mood normal.  ? ? ? ? ? ?   ?Assessment & Plan:  ? ?Problem List Items Addressed This Visit   ? ?  ? Other  ? Fatigue  ?  Vague fatigue, worse  before menses  ?Also irreg menses ?Nl exam  ? ?Lab today  ?upreg today  ?Disc imp of self care  ? ?  ?  ? Relevant Orders  ? CBC with Differential/Platelet  ? Comprehensive metabolic panel  ? TSH  ? Iron  ? Irregular menses  ?  Irreg, short and light  (had IUD last year) ?Had a few miscarriages/very early, wants to put off preg another year or more  ?Last month skipped/neg home preg test  ?  Interested in contraception  ?Declines STD screening  ? ?Nl exam  ?upreg negand fatigue labs ordered ?If nl will start low dose monophasic OC -loestrin 1/20 ?Long discussion re: way to take OC properly and avoidance of smoking  ?Risks of blood clots outlined as well as possible side eff ?Pt aware that this does not prevent stds and condoms should still be used ?inst that it may take up to 3 months for menses to fall into rhythm or side eff to stop  ?Adv to call if problems or questions  ? ?  ?  ? Relevant Orders  ? POCT urine pregnancy  ? ? ?

## 2021-08-26 NOTE — Assessment & Plan Note (Signed)
Vague fatigue, worse before menses  ?Also irreg menses ?Nl exam  ? ?Lab today  ?upreg today  ?Disc imp of self care  ? ?

## 2021-08-26 NOTE — Assessment & Plan Note (Addendum)
Irreg, short and light  (had IUD last year) ?Had a few miscarriages/very early, wants to put off preg another year or more  ?Last month skipped/neg home preg test  ?Interested in contraception  ?Declines STD screening  ? ?Nl exam  ?upreg negand fatigue labs ordered ?If nl will start low dose monophasic OC -loestrin 1/20 ?Long discussion re: way to take OC properly and avoidance of smoking  ?Risks of blood clots outlined as well as possible side eff ?Pt aware that this does not prevent stds and condoms should still be used ?inst that it may take up to 3 months for menses to fall into rhythm or side eff to stop  ?Adv to call if problems or questions  ? ?

## 2021-09-09 ENCOUNTER — Ambulatory Visit: Payer: Commercial Managed Care - PPO | Admitting: Family Medicine

## 2021-09-17 ENCOUNTER — Ambulatory Visit: Payer: Commercial Managed Care - PPO | Admitting: Family Medicine

## 2021-10-03 ENCOUNTER — Encounter: Payer: Self-pay | Admitting: Family Medicine

## 2021-10-05 MED ORDER — DROSPIRENONE-ETHINYL ESTRADIOL 3-0.02 MG PO TABS: 1.0000 | ORAL_TABLET | Freq: Every day | ORAL | 11 refills | Status: DC

## 2021-10-12 ENCOUNTER — Other Ambulatory Visit: Payer: Self-pay | Admitting: Family Medicine

## 2021-10-13 MED ORDER — METHYLPHENIDATE HCL ER (OSM) 18 MG PO TBCR: 18.0000 mg | EXTENDED_RELEASE_TABLET | Freq: Every day | ORAL | 0 refills | Status: DC

## 2021-10-13 NOTE — Telephone Encounter (Signed)
Name of Medication: Concerta Name of Pharmacy: [facility] Last [place] or Written Date and Quantity: 08/10/21 #30 tabs/ 0 refill Last Office Visit and Type: irregular periods 08/26/21 Next Office Visit and Type: none scheduled  Last Controlled Substance Agreement Date:  Last UDS:

## 2021-10-15 ENCOUNTER — Encounter: Payer: Self-pay | Admitting: Family Medicine

## 2021-10-15 MED ORDER — METHYLPHENIDATE HCL ER (OSM) 18 MG PO TBCR
18.0000 mg | EXTENDED_RELEASE_TABLET | Freq: Every day | ORAL | 0 refills | Status: DC
Start: 2021-10-15 — End: 2022-01-22

## 2021-12-07 ENCOUNTER — Ambulatory Visit (INDEPENDENT_AMBULATORY_CARE_PROVIDER_SITE_OTHER): Payer: Commercial Managed Care - PPO | Admitting: Family Medicine

## 2021-12-07 ENCOUNTER — Encounter: Payer: Self-pay | Admitting: Family Medicine

## 2021-12-07 VITALS — BP 100/70 | HR 75 | Temp 98.1°F | Ht 63.25 in | Wt 130.5 lb

## 2021-12-07 DIAGNOSIS — R5382 Chronic fatigue, unspecified: Secondary | ICD-10-CM | POA: Diagnosis not present

## 2021-12-07 DIAGNOSIS — R062 Wheezing: Secondary | ICD-10-CM

## 2021-12-07 DIAGNOSIS — Z91018 Allergy to other foods: Secondary | ICD-10-CM

## 2021-12-07 DIAGNOSIS — J301 Allergic rhinitis due to pollen: Secondary | ICD-10-CM | POA: Diagnosis not present

## 2021-12-07 MED ORDER — ALBUTEROL SULFATE HFA 108 (90 BASE) MCG/ACT IN AERS: 2.0000 | INHALATION_SPRAY | Freq: Four times a day (QID) | RESPIRATORY_TRACT | 1 refills | Status: DC | PRN

## 2021-12-07 NOTE — Assessment & Plan Note (Signed)
Struggles with pollen allergies and some medications are too sedating On allegra and nasacort  Now occ wheezing as well- ? If possible reactive airways  Referral done to allergist for further eval and tx  May be a candidate for immunotx Discussed allergen avoidance when able

## 2021-12-07 NOTE — Progress Notes (Signed)
Subjective:    Patient ID: Ariel Ortega, female    DOB: 11-05-1995, 26 y.o.   MRN: [id number]  HPI Pt presents for allergy symptoms   Wt Readings from Last 3 Encounters:  12/07/21 130 lb 8 oz (59.2 kg)  08/26/21 131 lb 4 oz (59.5 kg)  07/22/21 129 lb (58.5 kg)   22.93 kg/m  Allergic rhinitis  Worse spring through fall  Last visit tried repl claritin with zyrtec  Zyrtec did not seem to help more /made her sleepy   Now on allegra- less sedating   Felt wheezy once after using the elliptical machine  Used a primatine mist and it helped   Mild nasal congestion  Occ ears feel full /no pain  Pnd in throat   Occ itchy eyes  Face does not itch    Just cannot get in control   Very fatigued  Some vertigo symptoms - usually after being outdoors (and not just in the heat)   Worse when she came back from the beach   Intol of flonase (sedates)  Recommended nasacort- she tried it and it helps some    Fatigue  Usually episodic  3-4 days at a time   Lab Results  Component Value Date   IRON 95 08/26/2021   Cut back on caffeine with concerta (2 c coffee per day max now)   Changed her OC Never tried the yaz = decided she does not want to take OC  Now her menses is more normal also   Patient Active Problem List   Diagnosis Date Noted   Wheezing 12/07/2021   Irregular menses 08/26/2021   Fatigue 08/26/2021   Allergic rhinitis 07/22/2021   Swollen lymph nodes 01/21/2021   Hand paresthesia 12/14/2019   ADHD (attention deficit hyperactivity disorder), inattentive type 10/22/2019   Tinea corporis 06/28/2019   History of drug overdose 05/08/2014   Panic disorder 04/22/2014   History of depression 04/19/2014   GAD (generalized anxiety disorder) 12/25/2013   Panic attacks 12/25/2013   Food allergy 10/02/2012   Past Medical History:  Diagnosis Date   Anxiety    Depression 04/19/2014   Mononucleosis 04/09/11   Past Surgical History:  Procedure Laterality Date    WISDOM TOOTH EXTRACTION     Social History   Tobacco Use   Smoking status: Never   Smokeless tobacco: Never  Vaping Use   Vaping Use: Never used  Substance Use Topics   Alcohol use: No    Alcohol/week: 0.0 standard drinks of alcohol   Drug use: No   Family History  Adopted: Yes  Problem Relation Age of Onset   Cancer - Prostate Father    Hyperlipidemia Father    Hypertension Father    Cancer Father        prostate cancer    Depression Mother    Anxiety disorder Mother    Breast cancer Neg Hx    Ovarian cancer Neg Hx    Colon cancer Neg Hx    Allergies  Allergen Reactions   Adderall [Amphetamine-Dextroamphetamine]     Shoulder and neck pain    Effexor [Venlafaxine] Nausea And Vomiting   Milk-Related Compounds Nausea And Vomiting    Lactose intolerance   Current Outpatient Medications on File Prior to Visit  Medication Sig Dispense Refill   EPINEPHrine (EPIPEN) 0.3 mg/0.3 mL DEVI Inject 0.3 mLs (0.3 mg total) into the muscle once. (Patient taking differently: Inject 0.3 mg into the muscle as needed.) 2 Device 1  fexofenadine (ALLEGRA) 180 MG tablet Take 180 mg by mouth daily.     methylphenidate (CONCERTA) 18 MG PO CR tablet Take 1 tablet (18 mg total) by mouth daily. 30 tablet 0   triamcinolone (NASACORT) 55 MCG/ACT AERO nasal inhaler Place 2 sprays into the nose daily. 1 each 12   No current facility-administered medications on file prior to visit.    Review of Systems  Constitutional:  Positive for fatigue. Negative for activity change, appetite change, chills, diaphoresis, fever and unexpected weight change.  HENT:  Positive for congestion, postnasal drip, rhinorrhea and sinus pressure. Negative for ear pain, facial swelling, sore throat, trouble swallowing and voice change.   Eyes:  Negative for pain, redness and visual disturbance.  Respiratory:  Negative for cough, shortness of breath and wheezing.   Cardiovascular:  Negative for chest pain and palpitations.   Gastrointestinal:  Negative for abdominal pain, blood in stool, constipation and diarrhea.  Endocrine: Negative for polydipsia and polyuria.  Genitourinary:  Negative for dysuria, frequency and urgency.  Musculoskeletal:  Negative for arthralgias, back pain and myalgias.  Skin:  Negative for pallor and rash.  Allergic/Immunologic: Negative for environmental allergies.  Neurological:  Negative for dizziness, syncope and headaches.  Hematological:  Negative for adenopathy. Does not bruise/bleed easily.  Psychiatric/Behavioral:  Negative for decreased concentration and dysphoric mood. The patient is not nervous/anxious.        Objective:   Physical Exam Constitutional:      General: She is not in acute distress.    Appearance: Normal appearance. She is well-developed and normal weight. She is not ill-appearing or diaphoretic.  HENT:     Head: Normocephalic and atraumatic.     Right Ear: Tympanic membrane, ear canal and external ear normal. There is no impacted cerumen.     Left Ear: Tympanic membrane, ear canal and external ear normal. There is no impacted cerumen.     Ears:     Comments: TMs are dull    Nose: Congestion and rhinorrhea present.     Mouth/Throat:     Mouth: Mucous membranes are moist.     Pharynx: No oropharyngeal exudate or posterior oropharyngeal erythema.  Eyes:     General:        Right eye: No discharge.        Left eye: No discharge.     Conjunctiva/sclera: Conjunctivae normal.     Pupils: Pupils are equal, round, and reactive to light.  Neck:     Thyroid: No thyromegaly.     Vascular: No carotid bruit or JVD.  Cardiovascular:     Rate and Rhythm: Normal rate and regular rhythm.     Heart sounds: Normal heart sounds.     No gallop.  Pulmonary:     Effort: Pulmonary effort is normal. No respiratory distress.     Breath sounds: Normal breath sounds. No stridor. No wheezing, rhonchi or rales.     Comments: No rales /rhonchi or wheeze  No prolonged exp  phase  Chest:     Chest wall: No tenderness.  Abdominal:     General: There is no distension or abdominal bruit.     Palpations: Abdomen is soft.  Musculoskeletal:     Cervical back: Normal range of motion and neck supple.     Right lower leg: No edema.     Left lower leg: No edema.  Lymphadenopathy:     Cervical: No cervical adenopathy.  Skin:    General: Skin is warm and  dry.     Coloration: Skin is not pale.     Findings: No erythema or rash.  Neurological:     Mental Status: She is alert.     Coordination: Coordination normal.     Deep Tendon Reflexes: Reflexes are normal and symmetric. Reflexes normal.  Psychiatric:        Mood and Affect: Mood normal.           Assessment & Plan:   Problem List Items Addressed This Visit       Respiratory   Allergic rhinitis - Primary    Struggles with pollen allergies and some medications are too sedating On allegra and nasacort  Now occ wheezing as well- ? If possible reactive airways  Referral done to allergist for further eval and tx  May be a candidate for immunotx Discussed allergen avoidance when able        Relevant Orders   Ambulatory referral to Allergy     Other   Fatigue    I do wonder if her symptoms are hormonal Lab ordered incl mono test and FT4 Pend results  Disc sleep and eating habits Exercise may hep       Relevant Orders   CBC with Differential/Platelet   Comprehensive metabolic panel   TSH   T4, free   Mononucleosis screen   Food allergy    To milk related foods  Keeps epi pen      Wheezing    occ  ? If heat or exercise related Has allergy issues   Was using primatime mist  Px albuterol instead and gave inst re: how to use  Allergy referral done

## 2021-12-07 NOTE — Assessment & Plan Note (Signed)
I do wonder if her symptoms are hormonal Lab ordered incl mono test and FT4 Pend results  Disc sleep and eating habits Exercise may hep

## 2021-12-07 NOTE — Patient Instructions (Addendum)
I placed an allergy referral     If you don't hear in 1-2 weeks give [place] a call   Try the albuterol inhaler as needed for wheezing or chest tightness   Stay away from allergens  Stay out of the severe heat    Labs today for fatigue  Take care of yourself

## 2021-12-07 NOTE — Assessment & Plan Note (Signed)
To milk related foods  Keeps epi pen

## 2021-12-07 NOTE — Assessment & Plan Note (Signed)
occ  ? If heat or exercise related Has allergy issues   Was using primatime mist  Px albuterol instead and gave inst re: how to use  Allergy referral done

## 2021-12-08 LAB — CBC WITH DIFFERENTIAL/PLATELET
Basophils Absolute: 0.1 10*3/uL (ref 0.0–0.1)
Basophils Relative: 1 % (ref 0.0–3.0)
Eosinophils Absolute: 0.1 10*3/uL (ref 0.0–0.7)
Eosinophils Relative: 1.3 % (ref 0.0–5.0)
HCT: 39.7 % (ref 36.0–46.0)
Hemoglobin: 13.6 g/dL (ref 12.0–15.0)
Lymphocytes Relative: 37.4 % (ref 12.0–46.0)
Lymphs Abs: 2.2 10*3/uL (ref 0.7–4.0)
MCHC: 34.3 g/dL (ref 30.0–36.0)
MCV: 94.4 fl (ref 78.0–100.0)
Monocytes Absolute: 0.4 10*3/uL (ref 0.1–1.0)
Monocytes Relative: 6.8 % (ref 3.0–12.0)
Neutro Abs: 3.2 10*3/uL (ref 1.4–7.7)
Neutrophils Relative %: 53.5 % (ref 43.0–77.0)
Platelets: 222 10*3/uL (ref 150.0–400.0)
RBC: 4.2 Mil/uL (ref 3.87–5.11)
RDW: 13.1 % (ref 11.5–15.5)
WBC: 5.9 10*3/uL (ref 4.0–10.5)

## 2021-12-08 LAB — COMPREHENSIVE METABOLIC PANEL
ALT: 10 U/L (ref 0–35)
AST: 15 U/L (ref 0–37)
Albumin: 4.4 g/dL (ref 3.5–5.2)
Alkaline Phosphatase: 55 U/L (ref 39–117)
BUN: 8 mg/dL (ref 6–23)
CO2: 26 mEq/L (ref 19–32)
Calcium: 9.3 mg/dL (ref 8.4–10.5)
Chloride: 107 mEq/L (ref 96–112)
Creatinine, Ser: 0.67 mg/dL (ref 0.40–1.20)
GFR: 122.8 mL/min (ref >60.00)
Glucose, Bld: 62 mg/dL — ABNORMAL LOW (ref 70–99)
Potassium: 3.7 mEq/L (ref 3.5–5.1)
Sodium: 141 mEq/L (ref 135–145)
Total Bilirubin: 0.8 mg/dL (ref 0.2–1.2)
Total Protein: 6.8 g/dL (ref 6.0–8.3)

## 2021-12-08 LAB — T4, FREE: Free T4: 0.87 ng/dL (ref 0.60–1.60)

## 2021-12-08 LAB — MONONUCLEOSIS SCREEN: Mono Screen: NEGATIVE

## 2021-12-08 LAB — TSH: TSH: 1.14 u[IU]/mL (ref 0.35–5.50)

## 2022-01-05 ENCOUNTER — Encounter: Payer: Self-pay | Admitting: Family Medicine

## 2022-01-07 ENCOUNTER — Ambulatory Visit: Payer: Medicaid Other | Admitting: Primary Care

## 2022-01-10 ENCOUNTER — Encounter: Payer: Self-pay | Admitting: Family Medicine

## 2022-01-11 ENCOUNTER — Ambulatory Visit: Payer: Medicaid Other | Admitting: Family Medicine

## 2022-01-14 ENCOUNTER — Encounter: Payer: Self-pay | Admitting: Family Medicine

## 2022-01-14 ENCOUNTER — Ambulatory Visit (INDEPENDENT_AMBULATORY_CARE_PROVIDER_SITE_OTHER): Payer: Commercial Managed Care - PPO | Admitting: Family Medicine

## 2022-01-14 VITALS — BP 106/68 | HR 86 | Temp 98.0°F | Ht 63.25 in | Wt 132.1 lb

## 2022-01-14 DIAGNOSIS — R519 Headache, unspecified: Secondary | ICD-10-CM

## 2022-01-14 DIAGNOSIS — Z01419 Encounter for gynecological examination (general) (routine) without abnormal findings: Secondary | ICD-10-CM | POA: Insufficient documentation

## 2022-01-14 DIAGNOSIS — R5382 Chronic fatigue, unspecified: Secondary | ICD-10-CM | POA: Diagnosis not present

## 2022-01-14 MED ORDER — METHOCARBAMOL 500 MG PO TABS
500.0000 mg | ORAL_TABLET | Freq: Three times a day (TID) | ORAL | 1 refills | Status: DC | PRN
Start: 2022-01-14 — End: 2022-03-10

## 2022-01-14 MED ORDER — SUMATRIPTAN SUCCINATE 50 MG PO TABS: ORAL_TABLET | ORAL | 3 refills | Status: DC

## 2022-01-14 NOTE — Patient Instructions (Addendum)
Minimize caffeine unless you have a headache Keep a good sleep schedule   Try the muscle relaxer (methocarbamol) as needed for neck pain and spasm  Use gentle heat on neck when neck hurts  Cold on your head when you have a headache   For moderate to severe headache try generic imitrex 50   I do wonder if the fatigue and the headache are hormonal   I will refer you to gyn for a visit for first pap and also to discuss hormone change as potential trigger for headache or fatigue

## 2022-01-14 NOTE — Progress Notes (Signed)
Subjective:    Patient ID: Ariel Ortega, female    DOB: [birth year], 26 y.o.   MRN: [id number]  HPI Pt presents for c/o headache and fatigue   Wt Readings from Last 3 Encounters:  01/14/22 132 lb 2 oz (59.9 kg)  12/07/21 130 lb 8 oz (59.2 kg)  08/26/21 131 lb 4 oz (59.5 kg)   23.22 kg/m   Headache - started in the past 2 weeks  In neck and now in head  Last one was about 5 days long   Whole head  Some throbbing  No nausea  Some light and sound sensitivity  Worse with exertion  Some days wakes up with it  Other days happens in the early afternoon   Trigger- ? More sugar or caffeine    Otc: Ibuprofen  Heat on neck Tylenol or excedrin  (migraine)  Nothing helped   Stress level is fine -no new stress/ doing ok    Menses : have been normal Not heavy or painful  Did not want to go on OC  Just ending period-does not think she is pregnant   Tired during Headache tends to be after   Gyn care  Has not had pap or gyn visit yet    Fatigue - is lasting longer  Now instead of 3-4 d it lasts over 2 weeks Is constant  Brain fog is bad  (so bad she worries about her reaction time)  Getting worse instead of better   Takes concerta   Takes allegra  Few times took benadryl   Still waiting on an allergist appt     Caffeine: more if she gets tired  1-3 per day - coffee   Alcohol: none  About 60 oz of water per day  Exercises - less due to fatigue     Lab Results  Component Value Date   WBC 5.9 12/07/2021   HGB 13.6 12/07/2021   HCT 39.7 12/07/2021   MCV 94.4 12/07/2021   PLT 222.0 12/07/2021   Lab Results  Component Value Date   CREATININE 0.67 12/07/2021   BUN 8 12/07/2021   NA 141 12/07/2021   K 3.7 12/07/2021   CL 107 12/07/2021   CO2 26 12/07/2021   Lab Results  Component Value Date   ALT 10 12/07/2021   AST 15 12/07/2021   ALKPHOS 55 12/07/2021   BILITOT 0.8 12/07/2021   Lab Results  Component Value Date   IRON 95 08/26/2021    Lab Results  Component Value Date   TSH 1.14 12/07/2021  Free T4 was nl at 0.87  Mono test negative in July   Patient Active Problem List   Diagnosis Date Noted   Frequent headaches 01/14/2022   Encounter for routine gynecological examination 01/14/2022   Wheezing 12/07/2021   Irregular menses 08/26/2021   Fatigue 08/26/2021   Allergic rhinitis 07/22/2021   Hand paresthesia 12/14/2019   ADHD (attention deficit hyperactivity disorder), inattentive type 10/22/2019   History of drug overdose 05/08/2014   Panic disorder 04/22/2014   History of depression 04/19/2014   GAD (generalized anxiety disorder) 12/25/2013   Panic attacks 12/25/2013   Food allergy 10/02/2012   Past Medical History:  Diagnosis Date   Anxiety    Depression 04/19/2014   Mononucleosis 04/09/11   Past Surgical History:  Procedure Laterality Date   WISDOM TOOTH EXTRACTION     Social History   Tobacco Use   Smoking status: Never   Smokeless tobacco: Never  Vaping Use  Vaping Use: Never used  Substance Use Topics   Alcohol use: No    Alcohol/week: 0.0 standard drinks of alcohol   Drug use: No   Family History  Adopted: Yes  Problem Relation Age of Onset   Cancer - Prostate Father    Hyperlipidemia Father    Hypertension Father    Cancer Father        prostate cancer    Depression Mother    Anxiety disorder Mother    Breast cancer Neg Hx    Ovarian cancer Neg Hx    Colon cancer Neg Hx    Allergies  Allergen Reactions   Adderall [Amphetamine-Dextroamphetamine]     Shoulder and neck pain    Effexor [Venlafaxine] Nausea And Vomiting   Milk-Related Compounds Nausea And Vomiting    Lactose intolerance   Current Outpatient Medications on File Prior to Visit  Medication Sig Dispense Refill   albuterol (VENTOLIN HFA) 108 (90 Base) MCG/ACT inhaler Inhale 2 puffs into the lungs every 6 (six) hours as needed for wheezing or shortness of breath. 8 g 1   EPINEPHrine (EPIPEN) 0.3 mg/0.3 mL DEVI  Inject 0.3 mLs (0.3 mg total) into the muscle once. (Patient taking differently: Inject 0.3 mg into the muscle as needed.) 2 Device 1   fexofenadine (ALLEGRA) 180 MG tablet Take 180 mg by mouth daily.     methylphenidate (CONCERTA) 18 MG PO CR tablet Take 1 tablet (18 mg total) by mouth daily. 30 tablet 0   triamcinolone (NASACORT) 55 MCG/ACT AERO nasal inhaler Place 2 sprays into the nose daily. 1 each 12   No current facility-administered medications on file prior to visit.      Review of Systems  Constitutional:  Positive for fatigue. Negative for activity change, appetite change, fever and unexpected weight change.  HENT:  Negative for congestion, ear pain, rhinorrhea, sinus pressure and sore throat.   Eyes:  Negative for pain, redness and visual disturbance.  Respiratory:  Negative for cough, shortness of breath and wheezing.   Cardiovascular:  Negative for chest pain and palpitations.  Gastrointestinal:  Negative for abdominal pain, blood in stool, constipation and diarrhea.  Endocrine: Negative for polydipsia and polyuria.  Genitourinary:  Negative for dysuria, frequency, hematuria and urgency.  Musculoskeletal:  Negative for arthralgias, back pain and myalgias.  Skin:  Negative for pallor and rash.  Allergic/Immunologic: Negative for environmental allergies.  Neurological:  Positive for headaches. Negative for dizziness and syncope.  Hematological:  Negative for adenopathy. Does not bruise/bleed easily.  Psychiatric/Behavioral:  Negative for decreased concentration and dysphoric mood. The patient is not nervous/anxious.        Objective:   Physical Exam Constitutional:      General: She is not in acute distress.    Appearance: She is well-developed. She is not ill-appearing or diaphoretic.  HENT:     Head: Normocephalic and atraumatic.     Right Ear: Tympanic membrane, ear canal and external ear normal.     Left Ear: Tympanic membrane, ear canal and external ear normal.      Nose: Nose normal.     Comments: Boggy nares     Mouth/Throat:     Mouth: Mucous membranes are moist.     Pharynx: No oropharyngeal exudate or posterior oropharyngeal erythema.  Eyes:     General: No scleral icterus.       Right eye: No discharge.        Left eye: No discharge.     Extraocular  Movements: Extraocular movements intact.     Conjunctiva/sclera: Conjunctivae normal.     Pupils: Pupils are equal, round, and reactive to light.     Comments: No nystagmus  Neck:     Thyroid: No thyromegaly.     Vascular: No carotid bruit or JVD.     Trachea: No tracheal deviation.     Comments: Some posterior neck tenderness and trapezius tenderness  Nl rom    Cardiovascular:     Rate and Rhythm: Normal rate and regular rhythm.     Heart sounds: Normal heart sounds. No murmur heard. Pulmonary:     Effort: Pulmonary effort is normal. No respiratory distress.     Breath sounds: Normal breath sounds. No stridor. No wheezing, rhonchi or rales.  Abdominal:     General: Bowel sounds are normal. There is no distension.     Palpations: Abdomen is soft. There is no mass.     Tenderness: There is no abdominal tenderness. There is no right CVA tenderness, left CVA tenderness, guarding or rebound.     Comments: No suprapubic tenderness or fullness     Musculoskeletal:        General: No swelling, deformity or signs of injury.     Cervical back: Full passive range of motion without pain, normal range of motion and neck supple. Tenderness present. No rigidity.     Right lower leg: No edema.     Left lower leg: No edema.  Lymphadenopathy:     Cervical: No cervical adenopathy.  Skin:    General: Skin is warm and dry.     Coloration: Skin is not jaundiced or pale.     Findings: Lesion present. No bruising, erythema or rash.  Neurological:     Mental Status: She is alert and oriented to person, place, and time.     Cranial Nerves: No cranial nerve deficit, dysarthria or facial asymmetry.      Sensory: No sensory deficit.     Motor: No weakness, tremor, atrophy or abnormal muscle tone.     Coordination: Romberg sign negative. Coordination normal. Finger-Nose-Finger Test normal.     Gait: Gait is intact. Gait and tandem walk normal.     Deep Tendon Reflexes: Reflexes are normal and symmetric. Reflexes normal.     Comments: No focal cerebellar signs   Psychiatric:        Behavior: Behavior normal.        Thought Content: Thought content normal.           Assessment & Plan:   Problem List Items Addressed This Visit       Other   Fatigue    Episodic fatigue is becoming more pronounced and for longer periods of time  Reviewed last visit/work up and labs-all reassuring Reassuring exam ? If linked to her headaches  Disc poss this is hormonal  She has resisted hormonal contraception but may re consider  Open to referral to gyn        Relevant Orders   Ambulatory referral to Obstetrics / Gynecology   Frequent headaches - Primary    Lasting as long as 3 days/ sometimes waking with and some light and sound sensitivity  May be triggered by neck tension  Some migraine features Disc health habits for migraine and handout given  Disc use of memory foam pillow for neck Methocarbamol prn for neck pain  Limit caffeine and keep up water intake  Px imitrex 50 mg to try for rescue / disc  poss side eff Unsure if this may be linked to hormones as well as episodic fatigue       Relevant Medications   SUMAtriptan (IMITREX) 50 MG tablet   methocarbamol (ROBAXIN) 500 MG tablet   Other Relevant Orders   Ambulatory referral to Obstetrics / Gynecology

## 2022-01-14 NOTE — Assessment & Plan Note (Signed)
Lasting as long as 3 days/ sometimes waking with and some light and sound sensitivity  May be triggered by neck tension  Some migraine features Disc health habits for migraine and handout given  Disc use of memory foam pillow for neck Methocarbamol prn for neck pain  Limit caffeine and keep up water intake  Px imitrex 50 mg to try for rescue / disc poss side eff Unsure if this may be linked to hormones as well as episodic fatigue

## 2022-01-14 NOTE — Assessment & Plan Note (Signed)
Episodic fatigue is becoming more pronounced and for longer periods of time  Reviewed last visit/work up and labs-all reassuring Reassuring exam ? If linked to her headaches  Disc poss this is hormonal  She has resisted hormonal contraception but may re consider  Open to referral to gyn

## 2022-01-22 ENCOUNTER — Other Ambulatory Visit: Payer: Self-pay | Admitting: Family Medicine

## 2022-01-22 MED ORDER — METHYLPHENIDATE HCL ER (OSM) 18 MG PO TBCR: 18.0000 mg | EXTENDED_RELEASE_TABLET | Freq: Every day | ORAL | 0 refills | Status: DC

## 2022-01-22 NOTE — Telephone Encounter (Signed)
Name of Medication: Concerta Name of Pharmacy: [facility] [address] Last Fill or Written Date and Quantity: 10/15/21 #30 tab/ 0 refill Last Office Visit and Type: fatigue/ HA on 01/14/22 Next Office Visit and Type: none scheduled

## 2022-01-30 ENCOUNTER — Encounter: Payer: Self-pay | Admitting: Family Medicine

## 2022-02-01 ENCOUNTER — Other Ambulatory Visit: Payer: Self-pay | Admitting: *Deleted

## 2022-02-01 MED ORDER — NORETHINDRONE ACET-ETHINYL EST 1-20 MG-MCG PO TABS: 1.0000 | ORAL_TABLET | Freq: Every day | ORAL | 11 refills | Status: DC

## 2022-02-01 NOTE — Telephone Encounter (Signed)
Addressed through phone note 

## 2022-02-01 NOTE — Telephone Encounter (Signed)
Pt sent a message saying:  I was wondering if you would be willing to let me try birth control again. I am desperate to stop this fatigue. It's so much worse this month than last month & it's making it difficult to do anything. It started about a week before my period just like it did last month. They won't be able to see me at the [facility] until October & I'm worried about dealing with this again next month.  Thank you

## 2022-02-01 NOTE — Telephone Encounter (Signed)
I pended loestrin 1/20 to try  Please send to preferred pharmacy  Alert [place] if any problems or if she does not do well with it

## 2022-02-01 NOTE — Telephone Encounter (Signed)
Pt notified Rx sent to pharmacy and advised of Dr. Tower's comments  

## 2022-02-01 NOTE — Telephone Encounter (Signed)
Pt said Yaz gave her anxiety and made her bloated she wants PCP to prescribe a new BCP. Pt said period started on 01/29/22.  [place]

## 2022-02-01 NOTE — Telephone Encounter (Signed)
I think Leta Baptist was her last one -that ok?  When was her last period?

## 2022-02-18 ENCOUNTER — Encounter: Payer: Medicaid Other | Admitting: Obstetrics and Gynecology

## 2022-02-26 ENCOUNTER — Encounter: Payer: Self-pay | Admitting: Family Medicine

## 2022-02-26 ENCOUNTER — Telehealth: Payer: Self-pay | Admitting: *Deleted

## 2022-02-26 NOTE — Telephone Encounter (Signed)
Pt sent a message saying:  Good afternoon,  I'm so sorry to bother you all. I had a question concerning one of the labs. So I'm still experiencing the same symptoms I have been for months now, the fatigue is really getting to me. Nothing seems to be helping. I looked on my 22 & me & saw that I have a gene for hemochromatosis. I know that my labs said my Iron was normal, is it possible this could be my issue?  Again, so sorry to bother you all. I'm just desperate to feel better & get my life back. I appreciate your help. Please let me know your thoughts    **see media for pic pt uploaded

## 2022-02-27 ENCOUNTER — Telehealth: Payer: Self-pay | Admitting: Family Medicine

## 2022-02-27 DIAGNOSIS — R5382 Chronic fatigue, unspecified: Secondary | ICD-10-CM

## 2022-02-27 NOTE — Telephone Encounter (Signed)
Lab order ferritin future

## 2022-02-27 NOTE — Telephone Encounter (Signed)
We can do a test for iron stores (ferritin, trans ferritin saturation)  Please make a lab appt (you don't need to fast) and we can check that Thanks for the heads up

## 2022-03-01 NOTE — Telephone Encounter (Signed)
Sent mychart letting pt know  ?

## 2022-03-08 ENCOUNTER — Other Ambulatory Visit (INDEPENDENT_AMBULATORY_CARE_PROVIDER_SITE_OTHER): Payer: Commercial Managed Care - PPO

## 2022-03-08 DIAGNOSIS — R5382 Chronic fatigue, unspecified: Secondary | ICD-10-CM

## 2022-03-08 LAB — IBC + FERRITIN
Ferritin: 16.3 ng/mL (ref 10.0–291.0)
Iron: 93 ug/dL (ref 42–145)
Saturation Ratios: 26.8 % (ref 20.0–50.0)
TIBC: 347.2 ug/dL (ref 250.0–450.0)
Transferrin: 248 mg/dL (ref 212.0–360.0)

## 2022-03-08 LAB — FERRITIN: Ferritin: 15.4 ng/mL (ref 10.0–291.0)

## 2022-03-08 NOTE — Progress Notes (Signed)
GYNECOLOGY ANNUAL PHYSICAL EXAM PROGRESS NOTE  Subjective:    Ariel Ortega is a married 27 y.o. [id number] female who presents for an annual exam.  The patient is sexually active. The patient participates in regular exercise: yes. Has the patient ever been transfused or tattooed?: yes. The patient reports that there is not domestic violence in her life.   The patient has the following complaints today.  She has concerns about hormonal imbalance. Referred by her PCP. Noting fatigue, irregular cycles, brain fog. Has been ongoing for the past year but progressively worsened over the past 2 weeks. She was taking Loestrin for contraception and she stopped taking it because of the way it made her feel (only took for 1-2 weeks then just stopped last week due to side effects of bloating and overall malaise). Also has h/o IUD use in the past, this did not bother her (but had it removed as she desired fertility.    Menstrual History: Menarche age: 47 Patient's last menstrual period was 02/23/2022. Period Duration (Days): 1-2 Period Pattern: (!) Irregular Menstrual Flow: Heavy, Light Menstrual Control: Tampon Menstrual Control Change Freq (Hours): 3-4 Dysmenorrhea: None     Gynecologic History:  Contraception: none History of STI's: Denies Last Pap: Patient has never had one.     Upstream - 03/09/22 1533       Pregnancy Intention Screening   Does the patient want to become pregnant in the next year? No    Does the patient's partner want to become pregnant in the next year? No    Would the patient like to discuss contraceptive options today? No      Contraception Wrap Up   Current Method No Method - Other Reason    End Method No Method - Other Reason    Contraception Counseling Provided No    How was the end contraceptive method provided? N/A            The pregnancy intention screening data noted above was reviewed. Potential methods of contraception were discussed. The  patient elected to proceed with No Method - Other Reason.   OB History  Gravida Para Term Preterm AB Living  1 1 1  0 0 1  SAB IAB Ectopic Multiple Live Births  0 0 0 0 1    # Outcome Date GA Lbr Len/2nd Weight Sex Delivery Anes PTL Lv  1 Term 02/04/18 [redacted]w[redacted]d / 00:28 6 lb 9.5 oz (2.99 kg) F Vag-Spont EPI  LIV     Name: Emmalyne      Apgar1: 8  Apgar5: 9    Past Medical History:  Diagnosis Date   Anxiety    Depression 04/19/2014   Mononucleosis 04/09/11    Past Surgical History:  Procedure Laterality Date   WISDOM TOOTH EXTRACTION      Family History  Adopted: Yes  Problem Relation Age of Onset   Cancer - Prostate Father    Hyperlipidemia Father    Hypertension Father    Cancer Father        prostate cancer    Depression Mother    Anxiety disorder Mother    Breast cancer Neg Hx    Ovarian cancer Neg Hx    Colon cancer Neg Hx     Social History   Socioeconomic History   Marital status: Married    Spouse name: Not on file   Number of children: Not on file   Years of education: Not on file   Highest education level:  Not on file  Occupational History   Not on file  Tobacco Use   Smoking status: Never   Smokeless tobacco: Never  Vaping Use   Vaping Use: Never used  Substance and Sexual Activity   Alcohol use: No    Alcohol/week: 0.0 standard drinks of alcohol   Drug use: No   Sexual activity: Yes    Birth control/protection: I.U.D.  Other Topics Concern   Not on file  Social History Narrative   ** Merged History Encounter **       Social Determinants of Health   Financial Resource Strain: Not on file  Food Insecurity: Not on file  Transportation Needs: Not on file  Physical Activity: Not on file  Stress: Not on file  Social Connections: Not on file  Intimate Partner Violence: Not on file    Current Outpatient Medications on File Prior to Visit  Medication Sig Dispense Refill   albuterol (VENTOLIN HFA) 108 (90 Base) MCG/ACT inhaler Inhale 2  puffs into the lungs every 6 (six) hours as needed for wheezing or shortness of breath. 8 g 1   EPINEPHrine (EPIPEN) 0.3 mg/0.3 mL DEVI Inject 0.3 mLs (0.3 mg total) into the muscle once. (Patient taking differently: Inject 0.3 mg into the muscle as needed.) 2 Device 1   fexofenadine (ALLEGRA) 180 MG tablet Take 180 mg by mouth daily.     methocarbamol (ROBAXIN) 500 MG tablet Take 1 tablet (500 mg total) by mouth every 8 (eight) hours as needed for muscle spasms. Caution of sedatoin 30 tablet 1   methylphenidate (CONCERTA) 18 MG PO CR tablet Take 1 tablet (18 mg total) by mouth daily. 30 tablet 0   norethindrone-ethinyl estradiol (LOESTRIN 1/20, 21,) 1-20 MG-MCG tablet Take 1 tablet by mouth daily. 28 tablet 11   SUMAtriptan (IMITREX) 50 MG tablet Take once by mouth for moderate to severe headache, may repeat in 2 hours if headache persists or recurs. 10 tablet 3   triamcinolone (NASACORT) 55 MCG/ACT AERO nasal inhaler Place 2 sprays into the nose daily. 1 each 12   No current facility-administered medications on file prior to visit.    Allergies  Allergen Reactions   Adderall [Amphetamine-Dextroamphetamine]     Shoulder and neck pain    Effexor [Venlafaxine] Nausea And Vomiting   Milk-Related Compounds Nausea And Vomiting    Lactose intolerance     Review of Systems Constitutional: negative for chills, fevers and sweats. Positive for fatigue, mental fog.  Eyes: negative for irritation, redness and visual disturbance Ears, nose, mouth, throat, and face: negative for hearing loss, nasal congestion, snoring and tinnitus Respiratory: negative for asthma, cough, sputum Cardiovascular: negative for chest pain, dyspnea, exertional chest pressure/discomfort, irregular heart beat, palpitations and syncope Gastrointestinal: negative for abdominal pain, change in bowel habits, nausea and vomiting Genitourinary: positive for abnormal menstrual periods. Negative for genital lesions, sexual problems  and vaginal discharge, dysuria and urinary incontinence Integument/breast: negative for breast lump, breast tenderness and nipple discharge Hematologic/lymphatic: negative for bleeding and easy bruising Musculoskeletal:negative for back pain and muscle weakness Neurological: negative for dizziness, headaches, vertigo and weakness Endocrine: negative for diabetic symptoms including polydipsia, polyuria and skin dryness Allergic/Immunologic: negative for hay fever and urticaria      Objective:  Blood pressure (!) 99/56, pulse 75, resp. rate 16, height 5\' 3"  (1.6 m), weight 135 lb 8 oz (61.5 kg), last menstrual period 02/23/2022.  Body mass index is 24 kg/m.    General Appearance:    Alert, cooperative,  no distress, appears stated age  Head:    Normocephalic, without obvious abnormality, atraumatic  Eyes:    PERRL, conjunctiva/corneas clear, EOM's intact, both eyes  Ears:    Normal external ear canals, both ears  Nose:   Nares normal, septum midline, mucosa normal, no drainage or sinus tenderness  Throat:   Lips, mucosa, and tongue normal; teeth and gums normal  Neck:   Supple, symmetrical, trachea midline, no adenopathy; thyroid: no enlargement/tenderness/nodules; no carotid bruit or JVD  Back:     Symmetric, no curvature, ROM normal, no CVA tenderness  Lungs:     Clear to auscultation bilaterally, respirations unlabored  Chest Wall:    No tenderness or deformity   Heart:    Regular rate and rhythm, S1 and S2 normal, no murmur, rub or gallop  Breast Exam:    No tenderness, masses, or nipple abnormality  Abdomen:     Soft, non-tender, bowel sounds active all four quadrants, no masses, no organomegaly.    Genitalia:    Pelvic:external genitalia normal, vagina without lesions, discharge, or tenderness, rectovaginal septum  normal. Cervix normal in appearance, no cervical motion tenderness, no adnexal masses or tenderness.  Uterus normal size, shape, mobile, regular contours, nontender.   Rectal:    Normal external sphincter.  No hemorrhoids appreciated. Internal exam not done.   Extremities:   Extremities normal, atraumatic, no cyanosis or edema  Pulses:   2+ and symmetric all extremities  Skin:   Skin color, texture, turgor normal, no rashes or lesions  Lymph nodes:   Cervical, supraclavicular, and axillary nodes normal  Neurologic:   CNII-XII intact, normal strength, sensation and reflexes throughout   .  Labs:  Lab Results  Component Value Date   WBC 5.9 12/07/2021   HGB 13.6 12/07/2021   HCT 39.7 12/07/2021   MCV 94.4 12/07/2021   PLT 222.0 12/07/2021    Lab Results  Component Value Date   CREATININE 0.67 12/07/2021   BUN 8 12/07/2021   NA 141 12/07/2021   K 3.7 12/07/2021   CL 107 12/07/2021   CO2 26 12/07/2021    Lab Results  Component Value Date   ALT 10 12/07/2021   AST 15 12/07/2021   ALKPHOS 55 12/07/2021   BILITOT 0.8 12/07/2021    Lab Results  Component Value Date   TSH 1.14 12/07/2021     Assessment:   1. Screen for STD (sexually transmitted disease)   2. Cervical cancer screening   3. Irregular menses   4. Need for HPV vaccine   5. Other fatigue      Plan:  - Blood tests: Annual labs recently performed by PCP. Will check patient's hormone levels to assess for any abnormalities as cause of reported symptoms.  - Breast self exam technique reviewed and patient encouraged to perform self-exam monthly. - Contraception: none. - Discussed healthy lifestyle modifications. - Pap smear ordered.  - STD screening declined.  - COVID vaccination status: Declines.  - Discussed HPV vaccine, ok to begin series. First dose given today. RTC in 4-6 weeks for next injection.  - Follow up in 1 year for annual exam with PCP or GYN.    Rubie Maid, MD [place]

## 2022-03-09 ENCOUNTER — Other Ambulatory Visit (HOSPITAL_COMMUNITY)
Admission: RE | Admit: 2022-03-09 | Discharge: 2022-03-09 | Disposition: A | Discharge status: Home / Self Care | Payer: Commercial Managed Care - PPO | Source: Ambulatory Visit | Attending: Obstetrics and Gynecology | Admitting: Obstetrics and Gynecology

## 2022-03-09 ENCOUNTER — Encounter: Payer: Self-pay | Admitting: Obstetrics and Gynecology

## 2022-03-09 ENCOUNTER — Ambulatory Visit (INDEPENDENT_AMBULATORY_CARE_PROVIDER_SITE_OTHER): Payer: Commercial Managed Care - PPO | Admitting: Obstetrics and Gynecology

## 2022-03-09 VITALS — BP 99/56 | HR 75 | Resp 16 | Ht 63.0 in | Wt 135.5 lb

## 2022-03-09 DIAGNOSIS — Z01419 Encounter for gynecological examination (general) (routine) without abnormal findings: Secondary | ICD-10-CM | POA: Diagnosis not present

## 2022-03-09 DIAGNOSIS — Z113 Encounter for screening for infections with a predominantly sexual mode of transmission: Secondary | ICD-10-CM | POA: Diagnosis present

## 2022-03-09 DIAGNOSIS — Z124 Encounter for screening for malignant neoplasm of cervix: Secondary | ICD-10-CM | POA: Diagnosis present

## 2022-03-09 DIAGNOSIS — Z23 Encounter for immunization: Secondary | ICD-10-CM

## 2022-03-09 DIAGNOSIS — N926 Irregular menstruation, unspecified: Secondary | ICD-10-CM | POA: Diagnosis not present

## 2022-03-09 DIAGNOSIS — R5383 Other fatigue: Secondary | ICD-10-CM

## 2022-03-09 NOTE — Progress Notes (Unsigned)
NEW PATIENT Date of Service/Encounter:  03/10/22 Referring provider: Judy Pimple, MD Primary care provider: Judy Pimple, MD  Subjective:  Ariel Ortega is a 26 y.o. female with a PMHx of anxiety, depression, ADHD, headaches presenting today for evaluation of a multitude of symptoms and concern for possible allergies. History obtained from: chart review and patient.  Her main symptoms is vertigo, fatigue, neck pain, cold hands and feet and brain fog.  She reports that typically symptoms occur acutely and involve intense vertigo, fatigue and brain fog to the point of her being fearful to drive alone.  She has found herself leaving the stove burner on, etc. She denies any history of having COVID though was sick with minor respiratory illness last year and was not tested. She denies a family history of rheumatological disease, but does not know her mother's family history as she is her mother was adopted. She has had lab evaluations looking at iron levels, thyroid, CMP and CBC which have been unrevealing.  She did have hormones evaluated yesterday, but these labs have not returned. She has not yet seen a rheumatologist or had any imaging. She was told to see allergist to rule out allergies as cause of symptoms.   Chronic rhinitis: started around a year ago, progressively worsened over the past 4 months.  Main symptoms include: fatigue and vertigo.  Symptoms include: Denies nasal congestion, rhinorrhea, watery/itchy eyes.  Occasional ear fullness.   Occurs year-round Potential triggers: unknown Treatments tried: Flonase made her tired, Nasacort has helped some, taking Allegra-does not feel these help Previous allergy testing:  maybe in 2015; it was significantly positive to outdoor pollens History of reflux/heartburn: no  History of wheezing- Reported some wheezing following exercise. Has only happened once or twice. Does not feel albuterol has been overly helpful.   Food  intolerance: Milk/dairy-stomach gets upset. Can tolerate lactose free dairy.  She has carried an Epipen in the past due to facial swelling from being outdoors.  Denies any other systemic symptoms.  She did have one episode where her throat maybe felt tight, but this was over 8 years ago.   Other allergy screening: Food allergy: no Medication intolerance-effexor (nausea and vomiting) and adderal(shoulder pain) Hymenoptera allergy: no Urticaria: no Eczema:no History of recurrent infections suggestive of immunodeficency: no Vaccinations are up to date.   Past Medical History: Past Medical History:  Diagnosis Date   Angio-edema    Anxiety    Asthma    Depression 04/19/2014   Mononucleosis 04/09/2011   Medication List:  Current Outpatient Medications  Medication Sig Dispense Refill   albuterol (VENTOLIN HFA) 108 (90 Base) MCG/ACT inhaler Inhale 2 puffs into the lungs every 6 (six) hours as needed for wheezing or shortness of breath. 8 g 1   EPINEPHrine (EPIPEN) 0.3 mg/0.3 mL DEVI Inject 0.3 mLs (0.3 mg total) into the muscle once. (Patient taking differently: Inject 0.3 mg into the muscle as needed.) 2 Device 1   fexofenadine (ALLEGRA) 180 MG tablet Take 180 mg by mouth daily.     methylphenidate (CONCERTA) 18 MG PO CR tablet Take 1 tablet (18 mg total) by mouth daily. 30 tablet 0   norethindrone-ethinyl estradiol (LOESTRIN 1/20, 21,) 1-20 MG-MCG tablet Take 1 tablet by mouth daily. 28 tablet 11   SUMAtriptan (IMITREX) 50 MG tablet Take once by mouth for moderate to severe headache, may repeat in 2 hours if headache persists or recurs. 10 tablet 3   triamcinolone (NASACORT) 55 MCG/ACT AERO nasal inhaler  Place 2 sprays into the nose daily. 1 each 12   No current facility-administered medications for this visit.   Known Allergies:  Allergies  Allergen Reactions   Adderall [Amphetamine-Dextroamphetamine]     Shoulder and neck pain    Effexor [Venlafaxine] Nausea And Vomiting    Milk-Related Compounds Nausea And Vomiting    Lactose intolerance   Past Surgical History: Past Surgical History:  Procedure Laterality Date   WISDOM TOOTH EXTRACTION     Family History: Family History  Adopted: Yes  Problem Relation Age of Onset   Depression Mother    Anxiety disorder Mother    Cancer - Prostate Father    Hyperlipidemia Father    Hypertension Father    Cancer Father        prostate cancer    Breast cancer Neg Hx    Ovarian cancer Neg Hx    Colon cancer Neg Hx    Allergic rhinitis Neg Hx    Asthma Neg Hx    Eczema Neg Hx    Urticaria Neg Hx    Social History: Lyndsay lives in a house built around 27 years ago, no water damage, carpet floors, gas heating, central AC, indoor cats and dogs, no cockroaches, not using dust mite protection on the bedding or pillows, no smoke exposure, no HEPA filter in the home.  [place]   ROS:  All other systems negative except as noted per HPI.  Objective:  Blood pressure 98/62, pulse 74, temperature 98.1 F (36.7 C), temperature source Temporal, resp. rate 18, height 5' 3.6" (1.615 m), weight 135 lb 3.2 oz (61.3 kg), last menstrual period 02/23/2022, SpO2 99 %. Body mass index is 23.5 kg/m. Physical Exam:  General Appearance:  Alert, cooperative, no distress, appears stated age  Head:  Normocephalic, without obvious abnormality, atraumatic  Eyes:  Conjunctiva clear, EOM's intact  Nose: Nares normal, hypertrophic turbinates, normal mucosa, no visible anterior polyps, and septum midline  Throat: Lips, tongue normal; teeth and gums normal, normal posterior oropharynx  Neck: Supple, symmetrical  Lungs:   clear to auscultation bilaterally, Respirations unlabored, no coughing  Heart:  regular rate and rhythm and no murmur, Appears well perfused  Extremities: No edema  Skin: Skin color, texture, turgor normal, no rashes or lesions on visualized portions of skin  Neurologic: No gross deficits      Diagnostics: Spirometry:  Tracings reviewed. Her effort: It was hard to get consistent efforts and there is a question as to whether this reflects a maximal maneuver. FVC: 3.70L  FEV1: 3.37L, 102% predicted  FEV1/FVC ratio: 0.91 Interpretation: No overt abnormalities noted given today's efforts   Skin Testing: Environmental allergy panel and select foods.  Adequate controls. Results discussed with patient/family.  Airborne Adult Perc - 03/10/22 1004     Time Antigen Placed 1000    Allergen Manufacturer Greer    Location Back    Number of Test 59    Panel 1 Select    1. Control-Buffer 50% Glycerol 3+    2. Control-Histamine 1 mg/ml 3+    3. Albumin saline Negative    4. [place] Negative    5. [place] Negative    6. Johnson Negative    7. [place] Blue 3+    8. Meadow Fescue 3+    9. Perennial Rye 3+    10. Sweet Vernal 3+    11. Timothy 3+    12. Cocklebur Negative    13. Burweed Marshelder Negative  14. Ragweed, short Negative    15. Ragweed, Giant Negative    16. Plantain,  English 2+    17. Lamb's Quarters Negative    18. Sheep Sorrell Negative    19. Rough Pigweed Negative    20. Marsh Elder, Rough Negative    21. Mugwort, Common Negative    22. Ash mix Negative    23. Birch mix Negative    24. Beech American Negative    25. Box, Elder Negative    26. Cedar, red Negative    27. Cottonwood, [place]iss[place] Negative    28. Elm mix Negative    29. Hickory Negative    30. Maple mix Negative    31. Oak, [place] mix Negative    32. Pecan Pollen Negative    33. Pine mix Negative    34. Sycamore Eastern Negative    35. Walnut, Black Pollen Negative    36. Alternaria alternata 3+    37. Cladosporium Herbarum Negative    38. Aspergillus mix Negative    39. Penicillium mix Negative    40. Bipolaris sorokiniana (Helminthosporium) Negative    41. Drechslera spicifera (Curvularia) Negative    42. Mucor plumbeus Negative    43. Fusarium moniliforme 3+    44.  Aureobasidium pullulans (pullulara) Negative    45. Rhizopus oryzae Negative    46. Botrytis cinera Negative    47. Epicoccum nigrum Negative    48. Phoma betae Negative    49. Candida Albicans Negative    50. Trichophyton mentagrophytes Negative    51. Mite, D Farinae  5,000 AU/ml Negative    52. Mite, D Pteronyssinus  5,000 AU/ml Negative    53. Cat Hair 10,000 BAU/ml Negative    54.  Dog Epithelia Negative    55. Mixed Feathers Negative    56. Horse Epithelia Negative    57. Cockroach, German Negative    58. Mouse Negative    59. Tobacco Leaf Negative             Food Adult Perc - 03/10/22 1000     Time Antigen Placed 1000    Allergen Manufacturer Greer    Location Back    Number of allergen test 10    11. Pecan Food Omitted    12. [employer] Omitted    13. Almond Omitted    14. Hazelnut Omitted    15. [place] nut Omitted    16. Coconut Omitted    17. Pistachio Omitted    18. Catfish Omitted    19. Bass Omitted    20. Trout Omitted    21. Tuna Omitted    22. Salmon Omitted    23. Flounder Omitted    24. Codfish Omitted    25. Shrimp Omitted    26. Crab Omitted    27. Lobster Omitted    28. Oyster Omitted    29. Scallops Omitted    30. Barley Omitted    31. Oat  Omitted    32. Rye  Omitted    33. Hops Omitted    34. Rice Omitted    35. Cottonseed Omitted    36. Saccharomyces Cerevisiae  Omitted    37. Pork Omitted    38. [place] Meat Omitted    39. Chicken Meat Omitted    40. Beef Omitted    41. Lamb Omitted    42. Tomato Omitted    43. White Potato Omitted    44. Sweet Potato Omitted    45.  Pea, Green/English Omitted    46. Navy Bean Omitted    47. Mushrooms Omitted    48. Avocado Omitted    49. Onion Omitted    50. Cabbage Omitted    51. Carrots Omitted    52. Celery Omitted    53. Corn Omitted    54. Cucumber Omitted    55. Grape (White seedless) Omitted    56. Orange  Omitted    57. Banana Omitted    58. Apple Omitted    59. Peach  Omitted    60. Strawberry Omitted    61. Cantaloupe Omitted    62. Watermelon Omitted    63. Pineapple Omitted    64. Chocolate/Cacao bean Omitted    65. Karaya Gum Omitted    66. Acacia (Arabic Gum) Omitted    67. Cinnamon Omitted    68. Nutmeg Omitted    69. Ginger Omitted    70. Garlic Omitted    71. Pepper, black Omitted    72. Mustard Omitted             Allergy testing results were read and interpreted by myself, documented by clinical staff.  Assessment and Plan  Vertigo/Fatigue/Brain Fog:  -this constellation of symptoms is not typical of allergy symptoms - allergies provoke the release of histamine which can cause: runny nose, nasal congestion, watery itchy/eyes, rash, itchy skin, occasionally is more severe (and typically more common with food allergies) and can cause diarrhea, vomiting, low blood pressure - since you lack these symptoms and are not responding to antihistamines and other typical allergy medications, would continue to look for other possible causes   - allergy testing today was positive to grass pollen (summer allergen), and outdoor mold (alternaria), one minor mold (fusarium); borderline to one weed (english plantain)-fall time allergen - allergen avoidance as below - these are mostly seasonal allergens and would not explain your year round symptoms  - Continue Nasal Steroid Spray to help with ear fullness: Nasocort (triamcinolone), Nasonex (mometasome) 1- 2 sprays in each nostril daily Best results if used daily.  - Continue over the counter antihistamine daily as needed.  Do not suspect this will be effective for your main symptoms as these are not symptoms caused by histamine release. However if having nose or eye symptoms (runny nose, itchy eyes, etc) can use: -Your options include Zyrtec (Cetirizine) 10mg , Claritin (Loratadine) 10mg , Allegra (Fexofenadine) 180mg , or Xyzal (Levocetirinze) 5mg   Wheezing with exercise-possible exercise induced  bronchospasm: - your lung testing today looked okay today - Rescue Inhaler: Albuterol (Proair/Ventolin) 2 puffs . Use  every 4-6 hours as needed for chest tightness, wheezing, or coughing.  Can also use 15 minutes prior to exercise if you have symptoms with activity. - Asthma is not controlled if:  - Symptoms are occurring >2 times a week OR  - >2 times a month nighttime awakenings  - You are requiring systemic steroids (prednisone/steroid injections) more than once per year  - Your require hospitalization for your asthma.  - Please call the clinic to schedule a follow up if these symptoms arise  Follow-up as needed.  This note in its entirety was forwarded to the Provider who requested this consultation.  Thank you for your kind referral. I appreciate the opportunity to take part in Justyce's care. Please do not hesitate to contact me with questions.  Sincerely,  , MD [facility]

## 2022-03-09 NOTE — Patient Instructions (Signed)
Breast Self-Awareness Breast self-awareness is knowing how your breasts look and feel. You need to: Check your breasts on a regular basis. Tell your doctor about any changes. Become familiar with the look and feel of your breasts. This can help you catch a breast problem while it is still small and can be treated. You should do breast self-exams even if you have breast implants. What you need: A mirror. A well-lit room. A pillow or other soft object. How to do a breast self-exam Follow these steps to do a breast self-exam: Look for changes  Take off all the clothes above your waist. Stand in front of a mirror in a room with good lighting. Put your hands down at your sides. Compare your breasts in the mirror. Look for any difference between them, such as: A difference in shape. A difference in size. Wrinkles, dips, and bumps in one breast and not the other. Look at each breast for changes in the skin, such as: Redness. Scaly areas. Skin that has gotten thicker. Dimpling. Open sores (ulcers). Look for changes in your nipples, such as: Fluid coming out of a nipple. Fluid around a nipple. Bleeding. Dimpling. Redness. A nipple that looks pushed in (retracted), or that has changed position. Feel for changes Lie on your back. Feel each breast. To do this: Pick a breast to feel. Place a pillow under the shoulder closest to that breast. Put the arm closest to that breast behind your head. Feel the nipple area of that breast using the hand of your other arm. Feel the area with the pads of your three middle fingers by making small circles with your fingers. Use light, medium, and firm pressure. Continue the overlapping circles, moving downward over the breast. Keep making circles with your fingers. Stop when you feel your ribs. Start making circles with your fingers again, this time going upward until you reach your collarbone. Then, make circles outward across your breast and into your  armpit area. Squeeze your nipple. Check for discharge and lumps. Repeat these steps to check your other breast. Sit or stand in the tub or shower. With soapy water on your skin, feel each breast the same way you did when you were lying down. Write down what you find Writing down what you find can help you remember what to tell your doctor. Write down: What is normal for each breast. Any changes you find in each breast. These include: The kind of changes you find. A tender or painful breast. Any lump you find. Write down its size and where it is. When you last had your monthly period (menstrual cycle). General tips If you are breastfeeding, the best time to check your breasts is after you feed your baby or after you use a breast pump. If you get monthly bleeding, the best time to check your breasts is 5-7 days after your monthly cycle ends. With time, you will become comfortable with the self-exam. You will also start to know if there are changes in your breasts. Contact a doctor if: You see a change in the shape or size of your breasts or nipples. You see a change in the skin of your breast or nipples, such as red or scaly skin. You have fluid coming from your nipples that is not normal. You find a new lump or thick area. You have breast pain. You have any concerns about your breast health. Summary Breast self-awareness includes looking for changes in your breasts and feeling for changes   within your breasts. You should do breast self-awareness in front of a mirror in a well-lit room. If you get monthly periods (menstrual cycles), the best time to check your breasts is 5-7 days after your period ends. Tell your doctor about any changes you see in your breasts. Changes include changes in size, changes on the skin, painful or tender breasts, or fluid from your nipples that is not normal. This information is not intended to replace advice given to you by your health care provider. Make sure  you discuss any questions you have with your health care provider. Document Revised: 03/05/2021 Document Reviewed: 03/05/2021 Elsevier Patient Education  2023 Elsevier Inc. Preventive Care 21-39 Years Old, Female Preventive care refers to lifestyle choices and visits with your health care provider that can promote health and wellness. Preventive care visits are also called wellness exams. What can I expect for my preventive care visit? Counseling During your preventive care visit, your health care provider may ask about your: Medical history, including: Past medical problems. Family medical history. Pregnancy history. Current health, including: Menstrual cycle. Method of birth control. Emotional well-being. Home life and relationship well-being. Sexual activity and sexual health. Lifestyle, including: Alcohol, nicotine or tobacco, and drug use. Access to firearms. Diet, exercise, and sleep habits. Work and work environment. Sunscreen use. Safety issues such as seatbelt and bike helmet use. Physical exam Your health care provider may check your: Height and weight. These may be used to calculate your BMI (body mass index). BMI is a measurement that tells if you are at a healthy weight. Waist circumference. This measures the distance around your waistline. This measurement also tells if you are at a healthy weight and may help predict your risk of certain diseases, such as type 2 diabetes and high blood pressure. Heart rate and blood pressure. Body temperature. Skin for abnormal spots. What immunizations do I need?  Vaccines are usually given at various ages, according to a schedule. Your health care provider will recommend vaccines for you based on your age, medical history, and lifestyle or other factors, such as travel or where you work. What tests do I need? Screening Your health care provider may recommend screening tests for certain conditions. This may include: Pelvic exam  and Pap test. Lipid and cholesterol levels. Diabetes screening. This is done by checking your blood sugar (glucose) after you have not eaten for a while (fasting). Hepatitis B test. Hepatitis C test. HIV (human immunodeficiency virus) test. STI (sexually transmitted infection) testing, if you are at risk. BRCA-related cancer screening. This may be done if you have a family history of breast, ovarian, tubal, or peritoneal cancers. Talk with your health care provider about your test results, treatment options, and if necessary, the need for more tests. Follow these instructions at home: Eating and drinking  Eat a healthy diet that includes fresh fruits and vegetables, whole grains, lean protein, and low-fat dairy products. Take vitamin and mineral supplements as recommended by your health care provider. Do not drink alcohol if: Your health care provider tells you not to drink. You are pregnant, may be pregnant, or are planning to become pregnant. If you drink alcohol: Limit how much you have to 0-1 drink a day. Know how much alcohol is in your drink. In the [place] one drink equals one 12 oz bottle of beer (355 mL), one 5 oz glass of wine (148 mL), or one 1 oz glass of hard liquor (44 mL). Lifestyle Brush your teeth every morning and   night with fluoride toothpaste. Floss one time each day. Exercise for at least 30 minutes 5 or more days each week. Do not use any products that contain nicotine or tobacco. These products include cigarettes, chewing tobacco, and vaping devices, such as e-cigarettes. If you need help quitting, ask your health care provider. Do not use drugs. If you are sexually active, practice safe sex. Use a condom or other form of protection to prevent STIs. If you do not wish to become pregnant, use a form of birth control. If you plan to become pregnant, see your health care provider for a prepregnancy visit. Find healthy ways to manage stress, such as: Meditation, yoga, or  listening to music. Journaling. Talking to a trusted person. Spending time with friends and family. Minimize exposure to UV radiation to reduce your risk of skin cancer. Safety Always wear your seat belt while driving or riding in a vehicle. Do not drive: If you have been drinking alcohol. Do not ride with someone who has been drinking. If you have been using any mind-altering substances or drugs. While texting. When you are tired or distracted. Wear a helmet and other protective equipment during sports activities. If you have firearms in your house, make sure you follow all gun safety procedures. Seek help if you have been physically or sexually abused. What's next? Go to your health care provider once a year for an annual wellness visit. Ask your health care provider how often you should have your eyes and teeth checked. Stay up to date on all vaccines. This information is not intended to replace advice given to you by your health care provider. Make sure you discuss any questions you have with your health care provider. Document Revised: 10/29/2020 Document Reviewed: 10/29/2020 Elsevier Patient Education  2023 Elsevier Inc.  

## 2022-03-10 ENCOUNTER — Ambulatory Visit (INDEPENDENT_AMBULATORY_CARE_PROVIDER_SITE_OTHER): Payer: Commercial Managed Care - PPO | Admitting: Internal Medicine

## 2022-03-10 ENCOUNTER — Encounter: Payer: Self-pay | Admitting: Internal Medicine

## 2022-03-10 VITALS — BP 98/62 | HR 74 | Temp 98.1°F | Resp 18 | Ht 63.6 in | Wt 135.2 lb

## 2022-03-10 DIAGNOSIS — J301 Allergic rhinitis due to pollen: Secondary | ICD-10-CM | POA: Diagnosis not present

## 2022-03-10 DIAGNOSIS — R4189 Other symptoms and signs involving cognitive functions and awareness: Secondary | ICD-10-CM | POA: Diagnosis not present

## 2022-03-10 DIAGNOSIS — E739 Lactose intolerance, unspecified: Secondary | ICD-10-CM

## 2022-03-10 DIAGNOSIS — J45990 Exercise induced bronchospasm: Secondary | ICD-10-CM

## 2022-03-10 DIAGNOSIS — R5382 Chronic fatigue, unspecified: Secondary | ICD-10-CM | POA: Diagnosis not present

## 2022-03-10 DIAGNOSIS — J310 Chronic rhinitis: Secondary | ICD-10-CM

## 2022-03-10 DIAGNOSIS — R42 Dizziness and giddiness: Secondary | ICD-10-CM

## 2022-03-10 NOTE — Patient Instructions (Addendum)
Vertigo/Fatigue/Brain Fog:  -this constellation of symptoms is not typical of allergy symptoms - allergies provoke the release of histamine which can cause: runny nose, nasal congestion, watery itchy/eyes, rash, itchy skin, occasionally is more severe (and typically more common with food allergies) and can cause diarrhea, vomiting, low blood pressure - since you lack these symptoms and are not responding to antihistamines and other typical allergy medications, would continue to look for other possible causes   - allergy testing today was positive to grass pollen (summer allergen), and outdoor mold (alternaria), one minor mold (fusarium); borderline to one weed (english plantain)-fall time allergen - allergen avoidance as below - these are mostly seasonal allergens and would not explain your year round symptoms  - Continue Nasal Steroid Spray to help with ear fullness: Nasocort (triamcinolone), Nasonex (mometasome) 1- 2 sprays in each nostril daily Best results if used daily.  - Continue over the counter antihistamine daily as needed.  Do not suspect this will be effective for your main symptoms as these are not symptoms caused by histamine release. However if having nose or eye symptoms (runny nose, itchy eyes, etc) can use: -Your options include Zyrtec (Cetirizine) 10mg , Claritin (Loratadine) 10mg , Allegra (Fexofenadine) 180mg , or Xyzal (Levocetirinze) 5mg   Wheezing with exercise-possible exercise induced bronchospasm: - your lung testing today looked okay today - Rescue Inhaler: Albuterol (Proair/Ventolin) 2 puffs . Use  every 4-6 hours as needed for chest tightness, wheezing, or coughing.  Can also use 15 minutes prior to exercise if you have symptoms with activity. - Asthma is not controlled if:  - Symptoms are occurring >2 times a week OR  - >2 times a month nighttime awakenings  - You are requiring systemic steroids (prednisone/steroid injections) more than once per year  - Your require  hospitalization for your asthma.  - Please call the clinic to schedule a follow up if these symptoms arise  Follow-up as needed.  It was a pleasure meeting you today. Thank you for letting me participate in your care.  Reducing Pollen Exposure  The American Academy of Allergy, Asthma and Immunology suggests the following steps to reduce your exposure to pollen during allergy seasons.    Do not hang sheets or clothing out to dry; pollen may collect on these items. Do not mow lawns or spend time around freshly cut grass; mowing stirs up pollen. Keep windows closed at night.  Keep car windows closed while driving. Minimize morning activities outdoors, a time when pollen counts are usually at their highest. Stay indoors as much as possible when pollen counts or humidity is high and on windy days when pollen tends to remain in the air longer. Use air conditioning when possible.  Many air conditioners have filters that trap the pollen spores. Use a HEPA room air filter to remove pollen form the indoor air you breathe.  Control of Mold Allergen   Mold and fungi can grow on a variety of surfaces provided certain temperature and moisture conditions exist.  Outdoor molds grow on plants, decaying vegetation and soil.  The major outdoor mold, Alternaria and Cladosporium, are found in very high numbers during hot and dry conditions.  Generally, a late Summer - Fall peak is seen for common outdoor fungal spores.  Rain will temporarily lower outdoor mold spore count, but counts rise rapidly when the rainy period ends.  The most important indoor molds are Aspergillus and Penicillium.  Dark, humid and poorly ventilated basements are ideal sites for mold growth.  The next  most common sites of mold growth are the bathroom and the kitchen.  Outdoor (Seasonal) Mold Control  Use air conditioning and keep windows closed Avoid exposure to decaying vegetation. Avoid leaf raking. Avoid grain handling. Consider  wearing a face mask if working in moldy areas.

## 2022-03-11 LAB — CYTOLOGY - PAP: Diagnosis: NEGATIVE

## 2022-03-13 LAB — TESTOSTERONE, FREE, TOTAL, SHBG
Sex Hormone Binding: 55.5 nmol/L (ref 24.6–122.0)
Testosterone, Free: 0.9 pg/mL (ref 0.0–4.2)
Testosterone: 21 ng/dL (ref 13–71)

## 2022-03-13 LAB — FSH/LH
FSH: 5.1 m[IU]/mL
LH: 10.6 m[IU]/mL

## 2022-03-13 LAB — PROLACTIN: Prolactin: 8.9 ng/mL (ref 4.8–23.3)

## 2022-03-13 LAB — PROGESTERONE: Progesterone: 0.3 ng/mL

## 2022-03-13 LAB — ESTRADIOL: Estradiol: 71.3 pg/mL

## 2022-03-15 ENCOUNTER — Encounter: Payer: Self-pay | Admitting: Family Medicine

## 2022-03-15 ENCOUNTER — Encounter: Payer: Self-pay | Admitting: Obstetrics and Gynecology

## 2022-03-23 ENCOUNTER — Encounter: Payer: Self-pay | Admitting: Family Medicine

## 2022-03-23 ENCOUNTER — Ambulatory Visit (INDEPENDENT_AMBULATORY_CARE_PROVIDER_SITE_OTHER): Payer: Commercial Managed Care - PPO | Admitting: Family Medicine

## 2022-03-23 VITALS — BP 126/70 | HR 86 | Temp 98.2°F | Ht 63.5 in | Wt 134.2 lb

## 2022-03-23 DIAGNOSIS — N926 Irregular menstruation, unspecified: Secondary | ICD-10-CM | POA: Diagnosis not present

## 2022-03-23 DIAGNOSIS — F900 Attention-deficit hyperactivity disorder, predominantly inattentive type: Secondary | ICD-10-CM | POA: Diagnosis not present

## 2022-03-23 DIAGNOSIS — R5382 Chronic fatigue, unspecified: Secondary | ICD-10-CM

## 2022-03-23 DIAGNOSIS — R000 Tachycardia, unspecified: Secondary | ICD-10-CM | POA: Diagnosis not present

## 2022-03-23 NOTE — Assessment & Plan Note (Signed)
Pt is currently holding concerta for issues with tachycardia (but med does not seem to be the cause)

## 2022-03-23 NOTE — Patient Instructions (Addendum)
I want you to see cardiology for the heart rate changes   Take a look at the info on POTS (I wonder I you may have a component of this)  Stay hydrated  Sodium is ok (ok to eat some salty stuff) Sometimes support socks help a bit too  Look for some knee high support socks   If you feel faint- sit down/ drink something  If worse let us know  I will refer you to cardiology  If you don't get a call in 1-2 weeks call use

## 2022-03-23 NOTE — Assessment & Plan Note (Signed)
Reviewed our labs Also gyn w/u and labs   Now having some postural tachycardia  ? If poss POTS

## 2022-03-23 NOTE — Assessment & Plan Note (Signed)
Postural (with standing) but no bp change Demonstrated today in office  In setting of fatigue and anxiety symptoms as well   Reassuring EKG  Disc poss of POTS Ref made to cardiology for eval and tx  Disc use of supp hose/socks  Also inc salt intake and fluids  ER precautions noted in detail

## 2022-03-23 NOTE — Progress Notes (Signed)
Subjective:    Patient ID: Ariel Ortega, female    DOB: 11/04/1995, 26 y.o.   MRN: [id number]  HPI Pt presents with c/o palpitations   Wt Readings from Last 3 Encounters:  03/23/22 134 lb 4 oz (60.9 kg)  03/10/22 135 lb 3.2 oz (61.3 kg)  03/09/22 135 lb 8 oz (61.5 kg)   23.41 kg/m    Pt noted since this past Friday  Heart rate tends to stay in the 60-70 range when lying or sitting Goes up to 120 to 130 when standing (some times)  This is on her apple watch  She is aware of it  Feels like heart beats hard and fast  Occ pre syncopal (once in car felt hot and shaky and pulled over)  Then brain fog afterwards   Is staying hydrated  60 or more oz of water daily plus some tea and other drinks  Eating regularly    Worse when she is fatigued  Has had vertigo at times   BP Readings from Last 3 Encounters:  03/23/22 126/70  03/10/22 98/62  03/09/22 (!) 99/56   Pulse Readings from Last 3 Encounters:  03/23/22 86  03/10/22 74  03/09/22 75   Sitting 82 Standing today 110   Bp standing 122/70   EKG today NSR rate of 82 RSR in V1 No ST or T changes Nl PR and QT intervals   Takes concerta for ADD She stopped it weeks ago because she did not know if it added    Had nl labs from her obgyn Was sen for fatigue, irreg cycle, brain fog  OC caused bloating and malaise   Labs:  Latest Reference Range & Units Most Recent  LH mIU/mL 10.6 03/09/22 16:37  FSH mIU/mL 5.1 03/09/22 16:37  Prolactin 4.8 - 23.3 ng/mL 8.9 03/09/22 16:37  Glucose 70 - 99 mg/dL 62 (L) 11/07/74 28:31  Estradiol pg/mL 71.3 03/09/22 16:37  Preg Test, Ur Negative  Negative 08/26/21 08:59  Progesterone ng/mL 0.3 03/09/22 16:37  Sex Horm Binding Glob, Serum 24.6 - 122.0 nmol/L 55.5 03/09/22 16:37  Testosterone 13 - 71 ng/dL 21 51/76/16 07:37  Testosterone Free 0.0 - 4.2 pg/mL 0.9 03/09/22 16:37   Is going to to try progesterone only OC or IUD    Family hx Father has HTN and possibly  bradycardia   Patient Active Problem List   Diagnosis Date Noted   Tachycardia 03/23/2022   Lactose intolerance 03/10/2022   Exercise-induced bronchospasm 03/10/2022   Frequent headaches 01/14/2022   Encounter for routine gynecological examination 01/14/2022   Wheezing 12/07/2021   Irregular menses 08/26/2021   Fatigue 08/26/2021   Allergic rhinitis 07/22/2021   Hand paresthesia 12/14/2019   ADHD (attention deficit hyperactivity disorder), inattentive type 10/22/2019   History of drug overdose 05/08/2014   Panic disorder 04/22/2014   History of depression 04/19/2014   GAD (generalized anxiety disorder) 12/25/2013   Panic attacks 12/25/2013   Past Medical History:  Diagnosis Date   Angio-edema    Anxiety    Asthma    Depression 04/19/2014   Mononucleosis 04/09/2011   Past Surgical History:  Procedure Laterality Date   WISDOM TOOTH EXTRACTION     Social History   Tobacco Use   Smoking status: Never   Smokeless tobacco: Never  Vaping Use   Vaping Use: Never used  Substance Use Topics   Alcohol use: No    Alcohol/week: 0.0 standard drinks of alcohol   Drug use: No   Family  History  Adopted: Yes  Problem Relation Age of Onset   Depression Mother    Anxiety disorder Mother    Cancer - Prostate Father    Hyperlipidemia Father    Hypertension Father    Cancer Father        prostate cancer    Breast cancer Neg Hx    Ovarian cancer Neg Hx    Colon cancer Neg Hx    Allergic rhinitis Neg Hx    Asthma Neg Hx    Eczema Neg Hx    Urticaria Neg Hx    Allergies  Allergen Reactions   Adderall [Amphetamine-Dextroamphetamine]     Shoulder and neck pain    Effexor [Venlafaxine] Nausea And Vomiting   Milk-Related Compounds Nausea And Vomiting    Lactose intolerance   Current Outpatient Medications on File Prior to Visit  Medication Sig Dispense Refill   albuterol (VENTOLIN HFA) 108 (90 Base) MCG/ACT inhaler Inhale 2 puffs into the lungs every 6 (six) hours as needed  for wheezing or shortness of breath. 8 g 1   EPINEPHrine (EPIPEN) 0.3 mg/0.3 mL DEVI Inject 0.3 mLs (0.3 mg total) into the muscle once. (Patient taking differently: Inject 0.3 mg into the muscle as needed.) 2 Device 1   fexofenadine (ALLEGRA) 180 MG tablet Take 180 mg by mouth daily.     methylphenidate (CONCERTA) 18 MG PO CR tablet Take 1 tablet (18 mg total) by mouth daily. 30 tablet 0   SUMAtriptan (IMITREX) 50 MG tablet Take once by mouth for moderate to severe headache, may repeat in 2 hours if headache persists or recurs. 10 tablet 3   triamcinolone (NASACORT) 55 MCG/ACT AERO nasal inhaler Place 2 sprays into the nose daily. 1 each 12   No current facility-administered medications on file prior to visit.    Review of Systems  Constitutional:  Positive for fatigue. Negative for activity change, appetite change, fever and unexpected weight change.  HENT:  Negative for congestion, ear pain, rhinorrhea, sinus pressure and sore throat.   Eyes:  Negative for pain, redness and visual disturbance.  Respiratory:  Negative for cough, shortness of breath and wheezing.   Cardiovascular:  Positive for palpitations. Negative for chest pain and leg swelling.  Gastrointestinal:  Negative for abdominal pain, blood in stool, constipation and diarrhea.  Endocrine: Negative for polydipsia and polyuria.  Genitourinary:  Negative for dysuria, frequency and urgency.  Musculoskeletal:  Negative for arthralgias, back pain and myalgias.  Skin:  Negative for color change, pallor and rash.  Allergic/Immunologic: Negative for environmental allergies.  Neurological:  Positive for tremors and light-headedness. Negative for dizziness, seizures, syncope, speech difficulty, weakness, numbness and headaches.  Hematological:  Negative for adenopathy. Does not bruise/bleed easily.  Psychiatric/Behavioral:  Negative for decreased concentration and dysphoric mood. The patient is nervous/anxious.        Objective:    Physical Exam Constitutional:      General: She is not in acute distress.    Appearance: Normal appearance. She is well-developed and normal weight. She is not ill-appearing or diaphoretic.  HENT:     Head: Normocephalic and atraumatic.  Eyes:     Conjunctiva/sclera: Conjunctivae normal.     Pupils: Pupils are equal, round, and reactive to light.  Neck:     Thyroid: No thyromegaly.     Vascular: No carotid bruit or JVD.  Cardiovascular:     Rate and Rhythm: Normal rate and regular rhythm.     Pulses: Normal pulses.  Heart sounds: Normal heart sounds. No murmur heard.    No friction rub. No gallop.     Comments: Tachycardia with standing   pulse of 110  Pulmonary:     Effort: Pulmonary effort is normal. No respiratory distress.     Breath sounds: Normal breath sounds. No stridor. No wheezing, rhonchi or rales.  Chest:     Chest wall: No tenderness.  Abdominal:     General: There is no distension or abdominal bruit.     Palpations: Abdomen is soft. There is no mass.     Tenderness: There is no abdominal tenderness. There is no guarding or rebound.  Musculoskeletal:     Cervical back: Normal range of motion and neck supple.     Right lower leg: No edema.     Left lower leg: No edema.  Lymphadenopathy:     Cervical: No cervical adenopathy.  Skin:    General: Skin is warm and dry.     Coloration: Skin is not jaundiced or pale.     Findings: No rash.  Neurological:     Mental Status: She is alert.     Coordination: Coordination normal.     Deep Tendon Reflexes: Reflexes are normal and symmetric. Reflexes normal.  Psychiatric:        Attention and Perception: Attention normal.        Mood and Affect: Mood is anxious.        Behavior: Behavior normal.     Comments: Tearful at times  Pleasant            Assessment & Plan:   Problem List Items Addressed This Visit       Other   ADHD (attention deficit hyperactivity disorder), inattentive type    Pt is currently  holding concerta for issues with tachycardia (but med does not seem to be the cause)      Fatigue    Reviewed our labs Also gyn w/u and labs   Now having some postural tachycardia  ? If poss POTS      Relevant Orders   Ambulatory referral to Cardiology   Irregular menses    Pt plans to get IUD with gyn   Hormonal tests were reassuring/ rev GYN records      Tachycardia - Primary    Postural (with standing) but no bp change Demonstrated today in office  In setting of fatigue and anxiety symptoms as well   Reassuring EKG  Disc poss of POTS Ref made to cardiology for eval and tx  Disc use of supp hose/socks  Also inc salt intake and fluids  ER precautions noted in detail         Relevant Orders   EKG 12-Lead (Completed)   Ambulatory referral to Cardiology

## 2022-03-23 NOTE — Assessment & Plan Note (Signed)
Pt plans to get IUD with gyn   Hormonal tests were reassuring/ rev GYN records

## 2022-03-24 ENCOUNTER — Ambulatory Visit: Payer: Commercial Managed Care - PPO | Admitting: Family Medicine

## 2022-03-25 ENCOUNTER — Encounter: Payer: Self-pay | Admitting: Cardiology

## 2022-03-25 ENCOUNTER — Ambulatory Visit: Payer: Commercial Managed Care - PPO | Attending: Cardiology

## 2022-03-25 ENCOUNTER — Ambulatory Visit: Payer: Commercial Managed Care - PPO | Attending: Cardiology | Admitting: Cardiology

## 2022-03-25 ENCOUNTER — Other Ambulatory Visit: Payer: Self-pay

## 2022-03-25 VITALS — Ht 63.0 in | Wt 133.0 lb

## 2022-03-25 DIAGNOSIS — R42 Dizziness and giddiness: Secondary | ICD-10-CM

## 2022-03-25 DIAGNOSIS — R002 Palpitations: Secondary | ICD-10-CM | POA: Diagnosis not present

## 2022-03-25 NOTE — Addendum Note (Signed)
Addended by: Frutoso Schatz on: 03/25/2022 02:29 PM   Modules accepted: Orders

## 2022-03-25 NOTE — Patient Instructions (Signed)
Medication Instructions:  Your physician recommends that you continue on your current medications as directed. Please refer to the Current Medication list given to you today.  *If you need a refill on your cardiac medications before your next appointment, please call your pharmacy*   Lab Work: TSH, cortisol, CBC, BMET, urinalysis - go to [facility] in the morning to have labs done.   If you have labs (blood work) drawn today and your tests are completely normal, you will receive your results only by: [facility] (if you have MyChart) OR A paper copy in the mail If you have any lab test that is abnormal or we need to change your treatment, we will call you to review the results.  Testing/Procedures: Your physician has requested that you have an echocardiogram. Echocardiography is a painless test that uses sound waves to create images of your heart. It provides your doctor with information about the size and shape of your heart and how well your heart's chambers and valves are working. This procedure takes approximately one hour. There are no restrictions for this procedure. Please do NOT wear cologne, perfume, aftershave, or lotions (deodorant is allowed). Please arrive 15 minutes prior to your appointment time.  Your physician has recommended that you wear an event monitor. Event monitors are medical devices that record the heart's electrical activity. Doctors most often [place] these monitors to diagnose arrhythmias. Arrhythmias are problems with the speed or rhythm of the heartbeat. The monitor is a small, portable device. You can wear one while you do your normal daily activities. This is usually used to diagnose what is causing palpitations/syncope (passing out).  Follow-Up: At [facility], you and your health needs are our priority.  As part of our continuing mission to provide you with exceptional heart care, we have created designated Provider Care Teams.  These Care Teams include  your primary Cardiologist (physician) and Advanced Practice Providers (APPs -  Physician Assistants and Nurse Practitioners) who all work together to provide you with the care you need, when you need it.  Your next appointment:   6 week(s)  The format for your next appointment:   In Person  Provider:   Fransico Him, MD     Other Instructions ZIO AT Long term monitor-Live Telemetry  Your physician has requested you wear a ZIO patch monitor for 14 days.  This is a single patch monitor. [employer] supplies one patch monitor per enrollment. Additional  stickers are not available.  Please do not apply patch if you will be having a Nuclear Stress Test, Echocardiogram, Cardiac CT, MRI,  or Chest Xray during the period you would be wearing the monitor. The patch cannot be worn during  these tests. You cannot remove and re-apply the ZIO AT patch monitor.  Your ZIO patch monitor will be mailed 3 day USPS to your address on file. It may take 3-5 days to  receive your monitor after you have been enrolled.  Once you have received your monitor, please review the enclosed instructions. Your monitor has  already been registered assigning a specific monitor serial # to you.   Billing and Patient Assistance Program information  Theodore Demark has been supplied with any insurance information on record for billing. [employer] offers a sliding scale Patient Assistance Program for patients without insurance, or whose  insurance does not completely cover the cost of the ZIO patch monitor. You must apply for the  Patient Assistance Program to qualify for the discounted rate. To apply, call  [employer] at [phone],  select option 4, select option 2 , ask to apply for the Patient Assistance Program, (you can request an  interpreter if needed). [employer] will ask your household income and how many people are in your  household. [employer] will quote your out-of-pocket cost based on this information. They will also be able  to  set up a 12 month interest free payment plan if needed.  Applying the monitor   Shave hair from upper left chest.  Hold the abrader disc by orange tab. Rub the abrader in 40 strokes over left upper chest as indicated in  your monitor instructions.  Clean area with 4 enclosed alcohol pads. Use all pads to ensure the area is cleaned thoroughly. Let  dry.  Apply patch as indicated in monitor instructions. Patch will be placed under collarbone on left side of  chest with arrow pointing upward.  Rub patch adhesive wings for 2 minutes. Remove the white label marked "1". Remove the white label  marked "2". Rub patch adhesive wings for 2 additional minutes.  While looking in a mirror, press and release button in center of patch. A small green light will flash 3-4  times. This will be your only indicator that the monitor has been turned on.  Do not shower for the first 24 hours. You may shower after the first 24 hours.  Press the button if you feel a symptom. You will hear a small click. Record Date, Time and Symptom in  the Patient Log.   Starting the Gateway  In your kit there is a Hydrographic surveyor box the size of a cellphone. This is Airline pilot. It transmits all your  recorded data to [facility]. This box must always stay within 10 feet of you. Open the box and push the *  button. There will be a light that blinks orange and then green a few times. When the light stops  blinking, the Gateway is connected to the ZIO patch. Call [employer] at [phone] to confirm your monitor is transmitting.  Returning your monitor  Remove your patch and place it inside the [place]. In the lower half of the Gateway there is a white  bag with prepaid postage on it. Place Gateway in bag and seal. Mail package back to [place] as soon as  possible. Your physician should have your final report approximately 7 days after you have mailed back  your monitor. Call [place] at [phone] if  you have questions regarding your ZIO AT  patch monitor. Call them immediately if you see an orange light blinking on your monitor.  If your monitor falls off in less than 4 days, contact our Monitor department at [phone]. If your  monitor becomes loose or falls off after 4 days call [employer] at [phone] for suggestions on  securing your monitor   Important Information About Sugar

## 2022-03-25 NOTE — Progress Notes (Unsigned)
Enrolled for [employer] to mail a ZIO XT long term holter monitor to the patients address on file.  

## 2022-03-25 NOTE — Progress Notes (Signed)
Cardiology CONSULT Note    Date:  03/25/2022   ID:  Ariel Ortega, DOB 1996/02/01, MRN [id number]  PCP:  Judy Pimple, MD  Cardiologist:  Armanda Magic, MD   Chief Complaint  Patient presents with   New Patient (Initial Visit)    Palpitations    History of Present Illness:  Ariel Ortega is a 26 y.o. female who is being seen today for the evaluation of Palpitations at the request of Tower, Audrie Gallus, MD.  This is a 26yo female with a hx of anxiety, asthma and depression who is referred for evaluation of palpitations.  The patient presented to her PCP yesterday complaining of palpitations since Friday.  She says her heart rate would stay in the 60s to 70s when lying or sitting but she noticed that whenever she did start to stand up her heart rate did shoot up in the 120s 130s.  It feels like her heart is beating hard and fast.  One time she had it while driving and felt hot and shaky and had to pull over.  She had some brain fog afterwards and felt like she was going to pass out.  She had another time when she got out of the shower. She is trying to stay hydrated and drinks at least 60 ounces of water daily plus some decaffeinated tea.  When she gets fatigued her symptoms seem worse.  She takes Concerta for ADD.  She did stop it weeks ago because she did not know if it was helping.  She denies any chest pain or pressure.  She has noticed some DOE for the past few months but no LE edema. She has not had any syncope.  Past Medical History:  Diagnosis Date   Angio-edema    Anxiety    Asthma    Depression 04/19/2014   Mononucleosis 04/09/2011    Past Surgical History:  Procedure Laterality Date   WISDOM TOOTH EXTRACTION      Current Medications: No outpatient medications have been marked as taking for the 03/25/22 encounter (Office Visit) with Quintella Reichert, MD.    Allergies:   Adderall [amphetamine-dextroamphetamine], Effexor [venlafaxine], and Milk-related compounds   Social  History   Socioeconomic History   Marital status: Married    Spouse name: Not on file   Number of children: Not on file   Years of education: Not on file   Highest education level: Not on file  Occupational History   Not on file  Tobacco Use   Smoking status: Never   Smokeless tobacco: Never  Vaping Use   Vaping Use: Never used  Substance and Sexual Activity   Alcohol use: No    Alcohol/week: 0.0 standard drinks of alcohol   Drug use: No   Sexual activity: Yes    Birth control/protection: I.U.D.  Other Topics Concern   Not on file  Social History Narrative   ** Merged History Encounter **       Social Determinants of Health   Financial Resource Strain: Not on file  Food Insecurity: Not on file  Transportation Needs: Not on file  Physical Activity: Not on file  Stress: Not on file  Social Connections: Not on file     Family History:  The patient's family history includes Anxiety disorder in her mother; Cancer in her father; Cancer - Prostate in her father; Depression in her mother; Hyperlipidemia in her father; Hypertension in her father. She was adopted.   ROS:  Please see the history of present illness.    ROS All other systems reviewed and are negative.      No data to display             PHYSICAL EXAM:   VS:  Ht 5\' 3"  (1.6 m)   Wt 133 lb (60.3 kg)   LMP 02/23/2022   BMI 23.56 kg/m    GEN: Well nourished, well developed, in no acute distress  HEENT: normal  Neck: no JVD, carotid bruits, or masses Cardiac: RRR; no murmurs, rubs, or gallops,no edema.  Intact distal pulses bilaterally.  Respiratory:  clear to auscultation bilaterally, normal work of breathing GI: soft, nontender, nondistended, + BS MS: no deformity or atrophy  Skin: warm and dry, no rash Neuro:  Alert and Oriented x 3, Strength and sensation are intact Psych: euthymic mood, full affect  Wt Readings from Last 3 Encounters:  03/25/22 133 lb (60.3 kg)  03/23/22 134 lb 4 oz (60.9  kg)  03/10/22 135 lb 3.2 oz (61.3 kg)      Studies/Labs Reviewed:   EKG:  EKG is not ordered today.  The ekg ordered 03/23/22 demonstrates NSR with no ST changes  Recent Labs: 12/07/2021: ALT 10; BUN 8; Creatinine, Ser 0.67; Hemoglobin 13.6; Platelets 222.0; Potassium 3.7; Sodium 141; TSH 1.14   Lipid Panel    Component Value Date/Time   CHOL 141 04/20/2014 0630   TRIG 39 04/20/2014 0630   HDL 70 04/20/2014 0630   CHOLHDL 2.0 04/20/2014 0630   VLDL 8 04/20/2014 0630   LDLCALC 63 04/20/2014 0630     Additional studies/ records that were reviewed today include:  none    ASSESSMENT:    1. Palpitations      PLAN:  In order of problems listed above:  Palpitations -EKG in PCP office was normal -Her heart rate states to be faster when she goes from sitting to standing -Orthostatic blood pressures in the office were normal >> she notices the palpitations both sitting and standing so not completely consistent with POTS -TSH in July was normal but will recheck -recheck TSH and also CBC -check an AM cortisol level to assess for adrenal insuff -check UA for SG and urine Na -encourage to increase fluids more -check 2D echo to assess LVF -check a 2 week Ziopatch to assess average HR  Followup with me in 6 weeks  Time Spent: 20 minutes total time of encounter, including 15 minutes spent in face-to-face patient care on the date of this encounter. This time includes coordination of care and counseling regarding above mentioned problem list. Remainder of non-face-to-face time involved reviewing chart documents/testing relevant to the patient encounter and documentation in the medical record. I have independently reviewed documentation from referring provider  Medication Adjustments/Labs and Tests Ordered: Current medicines are reviewed at length with the patient today.  Concerns regarding medicines are outlined above.  Medication changes, Labs and Tests ordered today are listed in  the Patient Instructions below.  There are no Patient Instructions on file for this visit.   Signed, August, MD  03/25/2022 2:22 PM    [facility] [address] [place], [place], [place]  [place] Phone: [phone]; Fax: [phone]

## 2022-03-29 DIAGNOSIS — R002 Palpitations: Secondary | ICD-10-CM

## 2022-03-29 DIAGNOSIS — R42 Dizziness and giddiness: Secondary | ICD-10-CM

## 2022-04-14 ENCOUNTER — Ambulatory Visit (HOSPITAL_COMMUNITY): Payer: Commercial Managed Care - PPO | Attending: Cardiovascular Disease

## 2022-04-14 DIAGNOSIS — R42 Dizziness and giddiness: Secondary | ICD-10-CM | POA: Insufficient documentation

## 2022-04-14 DIAGNOSIS — R002 Palpitations: Secondary | ICD-10-CM | POA: Insufficient documentation

## 2022-04-14 DIAGNOSIS — R000 Tachycardia, unspecified: Secondary | ICD-10-CM | POA: Insufficient documentation

## 2022-04-14 DIAGNOSIS — R0602 Shortness of breath: Secondary | ICD-10-CM | POA: Insufficient documentation

## 2022-04-14 DIAGNOSIS — R079 Chest pain, unspecified: Secondary | ICD-10-CM | POA: Insufficient documentation

## 2022-04-14 LAB — ECHOCARDIOGRAM COMPLETE
Area-P 1/2: 3.39 cm2
S' Lateral: 2.6 cm

## 2022-04-22 DIAGNOSIS — R002 Palpitations: Secondary | ICD-10-CM | POA: Diagnosis not present

## 2022-04-22 DIAGNOSIS — R42 Dizziness and giddiness: Secondary | ICD-10-CM | POA: Diagnosis not present

## 2022-05-03 ENCOUNTER — Ambulatory Visit: Payer: Commercial Managed Care - PPO | Admitting: Family Medicine

## 2022-05-04 ENCOUNTER — Ambulatory Visit: Payer: Commercial Managed Care - PPO | Admitting: Physician Assistant

## 2022-05-27 ENCOUNTER — Telehealth: Payer: Self-pay

## 2022-05-27 DIAGNOSIS — R000 Tachycardia, unspecified: Secondary | ICD-10-CM

## 2022-05-27 NOTE — Telephone Encounter (Signed)
-----   Message from Precious Gilding, RN sent at 05/27/2022  4:24 PM EST -----  ----- Message ----- From: Sueanne Margarita, MD Sent: 05/27/2022  11:45 AM EST To: Abner Greenspan, MD; Cv Div Ch St Triage  Heart monitor overall was normal with a rare extra heartbeat from the top and bottom of the heart which are benign.  These can be triggered by her ADD drugs so she could talk to her PCP further about this.  TSH was normal in July 23.  Would have her come in for magnesium and potassium level.

## 2022-05-27 NOTE — Telephone Encounter (Signed)
Spoke with patient and reviewed heart monitor results, patient verbalized understanding of benign extra heartbeats and agrees to Mag and K+ labs.Orders placed, labs scheduled.

## 2022-05-27 NOTE — Addendum Note (Signed)
Addended by: Joni Reining on: 05/27/2022 05:23 PM   Modules accepted: Orders

## 2022-05-31 ENCOUNTER — Telehealth: Payer: Self-pay | Admitting: Physician Assistant

## 2022-05-31 NOTE — Telephone Encounter (Signed)
Patient called wanting to know if it's necessary for her to come in for her appt on 06/02/22.  She states she has racked up  lot of medically bills, and if it's not necessary for this upcoming appt she would like to cancel it. Please advise.

## 2022-05-31 NOTE — Telephone Encounter (Signed)
Called pt advised of MD recommendation:   If she is doing ok she does not need to come in   Called pt reports feels okay and would like to cancel appointment.  Advised pt to call back if has further concerns.  Expresses understanding.  OV and lab appointment both canceled.

## 2022-06-01 ENCOUNTER — Ambulatory Visit: Payer: Commercial Managed Care - PPO | Admitting: Physician Assistant

## 2022-06-02 ENCOUNTER — Other Ambulatory Visit: Payer: Commercial Managed Care - PPO

## 2022-06-02 ENCOUNTER — Ambulatory Visit: Payer: Commercial Managed Care - PPO | Admitting: Physician Assistant

## 2022-06-15 ENCOUNTER — Encounter: Payer: Self-pay | Admitting: Family Medicine

## 2022-06-18 ENCOUNTER — Ambulatory Visit (INDEPENDENT_AMBULATORY_CARE_PROVIDER_SITE_OTHER): Payer: Commercial Managed Care - PPO | Admitting: Family Medicine

## 2022-06-18 ENCOUNTER — Encounter: Payer: Self-pay | Admitting: Family Medicine

## 2022-06-18 VITALS — BP 110/56 | HR 88 | Temp 98.3°F | Ht 63.0 in | Wt 134.0 lb

## 2022-06-18 DIAGNOSIS — H109 Unspecified conjunctivitis: Secondary | ICD-10-CM | POA: Insufficient documentation

## 2022-06-18 DIAGNOSIS — B309 Viral conjunctivitis, unspecified: Secondary | ICD-10-CM | POA: Diagnosis not present

## 2022-06-18 MED ORDER — ERYTHROMYCIN 5 MG/GM OP OINT: 1.0000 | TOPICAL_OINTMENT | Freq: Three times a day (TID) | OPHTHALMIC | 0 refills | Status: DC

## 2022-06-18 NOTE — Progress Notes (Signed)
Subjective:    Patient ID: Ariel Ortega, female    DOB: 09-25-1995, 27 y.o.   MRN: [id number]  HPI Pt presents with symptoms of pink eye   Wt Readings from Last 3 Encounters:  06/18/22 134 lb (60.8 kg)  03/25/22 133 lb (60.3 kg)  03/23/22 134 lb 4 oz (60.9 kg)   23.74 kg/m  Has had a cold for the past week   1-2 days ago - eyes became itching and weepy  Not that red  Crusty stuff in lashes in the am  No vision change  No light sensitivity   Eyelids are swollen Cold is getting better \  No sinus pain or pressure   Patient Active Problem List   Diagnosis Date Noted   Conjunctivitis 06/18/2022   Tachycardia 03/23/2022   Lactose intolerance 03/10/2022   Exercise-induced bronchospasm 03/10/2022   Frequent headaches 01/14/2022   Encounter for routine gynecological examination 01/14/2022   Wheezing 12/07/2021   Irregular menses 08/26/2021   Fatigue 08/26/2021   Allergic rhinitis 07/22/2021   Hand paresthesia 12/14/2019   ADHD (attention deficit hyperactivity disorder), inattentive type 10/22/2019   History of drug overdose 05/08/2014   Panic disorder 04/22/2014   History of depression 04/19/2014   GAD (generalized anxiety disorder) 12/25/2013   Panic attacks 12/25/2013   Past Medical History:  Diagnosis Date   Angio-edema    Anxiety    Asthma    Depression 04/19/2014   Mononucleosis 04/09/2011   Past Surgical History:  Procedure Laterality Date   WISDOM TOOTH EXTRACTION     Social History   Tobacco Use   Smoking status: Never   Smokeless tobacco: Never  Vaping Use   Vaping Use: Never used  Substance Use Topics   Alcohol use: No    Alcohol/week: 0.0 standard drinks of alcohol   Drug use: No   Family History  Adopted: Yes  Problem Relation Age of Onset   Depression Mother    Anxiety disorder Mother    Cancer - Prostate Father    Hyperlipidemia Father    Hypertension Father    Cancer Father        prostate cancer    Breast cancer Neg Hx     Ovarian cancer Neg Hx    Colon cancer Neg Hx    Allergic rhinitis Neg Hx    Asthma Neg Hx    Eczema Neg Hx    Urticaria Neg Hx    Allergies  Allergen Reactions   Adderall [Amphetamine-Dextroamphetamine]     Shoulder and neck pain    Effexor [Venlafaxine] Nausea And Vomiting   Milk-Related Compounds Nausea And Vomiting    Lactose intolerance   No current outpatient medications on file prior to visit.   No current facility-administered medications on file prior to visit.    Review of Systems  Constitutional:  Positive for appetite change and fatigue. Negative for fever.  HENT:  Positive for congestion, postnasal drip, rhinorrhea, sneezing and sore throat. Negative for ear pain and sinus pressure.   Eyes:  Positive for discharge and itching. Negative for pain.  Respiratory:  Positive for cough. Negative for shortness of breath, wheezing and stridor.   Cardiovascular:  Negative for chest pain.  Gastrointestinal:  Negative for diarrhea, nausea and vomiting.  Genitourinary:  Negative for frequency, hematuria and urgency.  Musculoskeletal:  Negative for arthralgias and myalgias.  Skin:  Negative for rash.  Neurological:  Negative for dizziness, weakness, light-headedness and headaches.  Psychiatric/Behavioral:  Negative for confusion and  dysphoric mood.        Objective:   Physical Exam Constitutional:      General: She is not in acute distress.    Appearance: Normal appearance. She is normal weight. She is not ill-appearing or diaphoretic.  HENT:     Head: Normocephalic and atraumatic.     Comments: No facial swelling    Right Ear: Tympanic membrane, ear canal and external ear normal.     Left Ear: Tympanic membrane, ear canal and external ear normal.     Nose: Congestion and rhinorrhea present.     Mouth/Throat:     Mouth: Mucous membranes are moist.     Pharynx: Oropharynx is clear. No oropharyngeal exudate or posterior oropharyngeal erythema.  Eyes:     General:         Right eye: Discharge present.        Left eye: Discharge present.    Extraocular Movements: Extraocular movements intact.     Pupils: Pupils are equal, round, and reactive to light.     Comments: Very mild conj injection bilaterally  Scant cloudy discharge No eye or lid swelling  Vision grossly nl    Cardiovascular:     Rate and Rhythm: Normal rate and regular rhythm.  Pulmonary:     Effort: Pulmonary effort is normal. No respiratory distress.     Breath sounds: Normal breath sounds. No stridor. No wheezing, rhonchi or rales.  Musculoskeletal:     Cervical back: Neck supple.  Lymphadenopathy:     Cervical: No cervical adenopathy.  Skin:    General: Skin is warm and dry.     Findings: No rash.  Neurological:     Mental Status: She is alert.     Cranial Nerves: No cranial nerve deficit.  Psychiatric:        Mood and Affect: Mood normal.           Assessment & Plan:   Problem List Items Addressed This Visit       Other   Conjunctivitis - Primary    Viral - at the end of viral uri with congestion and cough  Reassuring exam  Disc symptom care with warm/cool compress and art tears  Disc hygiene /hand and linens  No contact lenses until clear  Dispose of contaminated products  Update if not starting to improve in a week or if worsening   Disc s/s of bact conjunctivitis to watch for  Given px for emycin ointment to use if symptoms greatly worsen over the weekend

## 2022-06-18 NOTE — Assessment & Plan Note (Signed)
Viral - at the end of viral uri with congestion and cough  Reassuring exam  Disc symptom care with warm/cool compress and art tears  Disc hygiene /hand and linens  No contact lenses until clear  Dispose of contaminated products  Update if not starting to improve in a week or if worsening   Disc s/s of bact conjunctivitis to watch for  Given px for emycin ointment to use if symptoms greatly worsen over the weekend

## 2022-06-18 NOTE — Patient Instructions (Addendum)
Don't wear your contacts until better  Dispose of contaminated make up or lenses Wash your hands and linens frequently   In morning - use a warm damp cloth to wipe away the crust  Cool compress helps discomfort   Artificial tears over the counter can help flush   Update if not starting to improve in a week or if worsening    If worse over the weekend, pain /redness/swelling- fill px for emycin ointment and start it  If you have vision change or light sensitivity go to the ER   Treat your cold symptoms as needed

## 2022-06-19 ENCOUNTER — Encounter: Payer: Self-pay | Admitting: Cardiology

## 2022-06-24 ENCOUNTER — Telehealth: Payer: Self-pay

## 2022-06-24 NOTE — Telephone Encounter (Signed)
Called patient back to discuss treatment options. Patient states she is still experiencing palpitations but so far she has incurred $3000 worth of debt related to doctor's visits and testing to assess her symptoms. She states she is worried about incurring more financial responsibility.   Discussed the option of her calling her insurance to determine what her financial portion of her lab work will be if she proceeds with all lab work Dr. Radford Pax has ordered. This may help her determine if and when she can afford to the labs Dr. Radford Pax requested. Patient agrees to this plan and states she will get in contact with her insurance and will call back when she is ready to schedule some or all of these labs.

## 2022-06-30 ENCOUNTER — Ambulatory Visit (INDEPENDENT_AMBULATORY_CARE_PROVIDER_SITE_OTHER): Payer: Commercial Managed Care - PPO | Admitting: Family Medicine

## 2022-06-30 ENCOUNTER — Encounter: Payer: Self-pay | Admitting: Family Medicine

## 2022-06-30 VITALS — BP 116/68 | HR 83 | Temp 98.0°F | Ht 63.0 in | Wt 133.2 lb

## 2022-06-30 DIAGNOSIS — H8113 Benign paroxysmal vertigo, bilateral: Secondary | ICD-10-CM

## 2022-06-30 MED ORDER — MECLIZINE HCL 25 MG PO TABS: 25.0000 mg | ORAL_TABLET | Freq: Three times a day (TID) | ORAL | 1 refills | Status: AC | PRN

## 2022-06-30 NOTE — Assessment & Plan Note (Signed)
Suspect viral and/or caused by ETD with sinus pressure Has had this in the past/prone to it  Fairly mild  Reassuring exam  Px meclizine 25 mg to try prn with caution of sedation  Handout with epley maneuver given  Rev ER precautions  Enc to keep hydrated  If not improved consider tx for ETD and ref to ENT  Update if not starting to improve in a week or if worsening

## 2022-06-30 NOTE — Patient Instructions (Addendum)
Drink fluids  Don't change position too quickly  Try the meclizine with caution of sedation   If you have nasal congestion flonase nasal spray can help   If not improved in several day take a look at the handout on the Epley maneuver   Update if not starting to improve in a week or if worsening

## 2022-06-30 NOTE — Progress Notes (Signed)
Subjective:    Patient ID: Ariel Ortega, female    DOB: 08-Oct-1995, 27 y.o.   MRN: [id number]  HPI Pt presents for dizziness  Ear problem   Wt Readings from Last 3 Encounters:  06/30/22 133 lb 4 oz (60.4 kg)  06/18/22 134 lb (60.8 kg)  03/25/22 133 lb (60.3 kg)   23.60 kg/m  Vitals:   06/30/22 1425  BP: 116/68  Pulse: 83  Temp: 98 F (36.7 C)  SpO2: 99%    Just got over cold  Had a lot of fullness in her ears  No pain   Now dizzy for 3 days Room is spinning  Some nausea  No vomiting   Had some meclizine left over from last bout Helped just a  little bit   Worse with pos change   No ha No sinus pressure   Patient Active Problem List   Diagnosis Date Noted   Conjunctivitis 06/18/2022   Tachycardia 03/23/2022   Lactose intolerance 03/10/2022   Exercise-induced bronchospasm 03/10/2022   Frequent headaches 01/14/2022   Encounter for routine gynecological examination 01/14/2022   Wheezing 12/07/2021   Irregular menses 08/26/2021   Fatigue 08/26/2021   Allergic rhinitis 07/22/2021   Hand paresthesia 12/14/2019   ADHD (attention deficit hyperactivity disorder), inattentive type 10/22/2019   BPV (benign positional vertigo) 09/23/2014   History of drug overdose 05/08/2014   Panic disorder 04/22/2014   History of depression 04/19/2014   GAD (generalized anxiety disorder) 12/25/2013   Panic attacks 12/25/2013   Past Medical History:  Diagnosis Date   Angio-edema    Anxiety    Asthma    Depression 04/19/2014   Mononucleosis 04/09/2011   Past Surgical History:  Procedure Laterality Date   WISDOM TOOTH EXTRACTION     Social History   Tobacco Use   Smoking status: Never   Smokeless tobacco: Never  Vaping Use   Vaping Use: Never used  Substance Use Topics   Alcohol use: No    Alcohol/week: 0.0 standard drinks of alcohol   Drug use: No   Family History  Adopted: Yes  Problem Relation Age of Onset   Depression Mother    Anxiety disorder  Mother    Cancer - Prostate Father    Hyperlipidemia Father    Hypertension Father    Cancer Father        prostate cancer    Breast cancer Neg Hx    Ovarian cancer Neg Hx    Colon cancer Neg Hx    Allergic rhinitis Neg Hx    Asthma Neg Hx    Eczema Neg Hx    Urticaria Neg Hx    Allergies  Allergen Reactions   Adderall [Amphetamine-Dextroamphetamine]     Shoulder and neck pain    Effexor [Venlafaxine] Nausea And Vomiting   Milk-Related Compounds Nausea And Vomiting    Lactose intolerance   No current outpatient medications on file prior to visit.   No current facility-administered medications on file prior to visit.      Review of Systems  Constitutional:  Negative for activity change, appetite change, fatigue, fever and unexpected weight change.  HENT:  Positive for congestion. Negative for ear discharge, ear pain, facial swelling, hearing loss, rhinorrhea, sinus pressure and sore throat.   Eyes:  Negative for photophobia, pain, redness, itching and visual disturbance.  Respiratory:  Negative for cough, shortness of breath and wheezing.   Cardiovascular:  Negative for chest pain and palpitations.  Gastrointestinal:  Negative for abdominal  pain, blood in stool, constipation and diarrhea.  Endocrine: Negative for polydipsia and polyuria.  Genitourinary:  Negative for dysuria, frequency and urgency.  Musculoskeletal:  Negative for arthralgias, back pain and myalgias.  Skin:  Negative for pallor and rash.  Allergic/Immunologic: Negative for environmental allergies.  Neurological:  Positive for dizziness. Negative for tremors, seizures, syncope, facial asymmetry, speech difficulty, weakness, light-headedness, numbness and headaches.  Hematological:  Negative for adenopathy. Does not bruise/bleed easily.  Psychiatric/Behavioral:  Negative for decreased concentration and dysphoric mood. The patient is not nervous/anxious.        Objective:   Physical Exam Constitutional:       General: She is not in acute distress.    Appearance: Normal appearance. She is well-developed and normal weight. She is not ill-appearing.  HENT:     Head: Normocephalic and atraumatic.     Right Ear: Tympanic membrane, ear canal and external ear normal. There is no impacted cerumen.     Left Ear: Tympanic membrane, ear canal and external ear normal. There is no impacted cerumen.     Nose: Rhinorrhea present.     Comments: Boggy nares Sniffling Clear rhinorrhea     Mouth/Throat:     Mouth: Mucous membranes are moist.     Pharynx: No posterior oropharyngeal erythema.  Eyes:     General:        Right eye: No discharge.        Left eye: No discharge.     Extraocular Movements: Extraocular movements intact.     Conjunctiva/sclera: Conjunctivae normal.     Pupils: Pupils are equal, round, and reactive to light.     Comments: 2-3 beats or horiz nystagmus bilaterally  This reprod her dizziness  Neck:     Thyroid: No thyromegaly.     Vascular: No carotid bruit or JVD.  Cardiovascular:     Rate and Rhythm: Normal rate and regular rhythm.     Heart sounds: Normal heart sounds.     No gallop.  Pulmonary:     Effort: Pulmonary effort is normal. No respiratory distress.     Breath sounds: Normal breath sounds. No stridor. No wheezing, rhonchi or rales.  Abdominal:     General: There is no distension or abdominal bruit.     Palpations: Abdomen is soft.  Musculoskeletal:     Cervical back: Normal range of motion and neck supple. No rigidity or tenderness.     Right lower leg: No edema.     Left lower leg: No edema.  Lymphadenopathy:     Cervical: No cervical adenopathy.  Skin:    General: Skin is warm and dry.     Coloration: Skin is not pale.     Findings: No rash.  Neurological:     Mental Status: She is alert.     Cranial Nerves: Cranial nerves 2-12 are intact. No cranial nerve deficit, dysarthria or facial asymmetry.     Sensory: No sensory deficit.     Motor: No weakness,  atrophy or seizure activity.     Coordination: Romberg sign negative. Coordination normal. Finger-Nose-Finger Test normal.     Gait: Gait normal.     Deep Tendon Reflexes: Reflexes are normal and symmetric. Reflexes normal.  Psychiatric:        Mood and Affect: Mood normal.           Assessment & Plan:   Problem List Items Addressed This Visit       Nervous and Auditory  BPV (benign positional vertigo) - Primary    Suspect viral and/or caused by ETD with sinus pressure Has had this in the past/prone to it  Fairly mild  Reassuring exam  Px meclizine 25 mg to try prn with caution of sedation  Handout with epley maneuver given  Rev ER precautions  Enc to keep hydrated  If not improved consider tx for ETD and ref to ENT  Update if not starting to improve in a week or if worsening

## 2022-07-09 ENCOUNTER — Ambulatory Visit: Payer: Commercial Managed Care - PPO

## 2022-08-02 LAB — POTASSIUM: Potassium: 4.2 mmol/L (ref 3.5–5.2)

## 2022-08-02 LAB — MAGNESIUM: Magnesium: 2.3 mg/dL (ref 1.6–2.3)

## 2022-08-03 ENCOUNTER — Telehealth: Payer: Self-pay

## 2022-08-03 NOTE — Telephone Encounter (Signed)
-----   Message from Sueanne Margarita, MD sent at 08/03/2022  6:53 AM EDT ----- Stable labs - continue current meds and forward to PCP

## 2022-08-03 NOTE — Telephone Encounter (Signed)
Left detailed message on voice mail per DPR, asked patient to call office if she had any questions. Forwarded to PCP.

## 2022-08-04 LAB — BASIC METABOLIC PANEL
BUN/Creatinine Ratio: 14 (ref 9–23)
BUN: 9 mg/dL (ref 6–20)
CO2: 20 mmol/L (ref 20–29)
Calcium: 9.5 mg/dL (ref 8.7–10.2)
Chloride: 106 mmol/L (ref 96–106)
Creatinine, Ser: 0.65 mg/dL (ref 0.57–1.00)
Glucose: 85 mg/dL (ref 70–99)
Potassium: 4.1 mmol/L (ref 3.5–5.2)
Sodium: 141 mmol/L (ref 134–144)
eGFR: 126 mL/min/{1.73_m2} (ref >59)

## 2022-08-04 LAB — URINALYSIS
Bilirubin, UA: NEGATIVE
Glucose, UA: NEGATIVE
Ketones, UA: NEGATIVE
Leukocytes,UA: NEGATIVE
Nitrite, UA: NEGATIVE
Protein,UA: NEGATIVE
RBC, UA: NEGATIVE
Specific Gravity, UA: 1.008 (ref 1.005–1.030)
Urobilinogen, Ur: 0.2 mg/dL (ref 0.2–1.0)
pH, UA: 7 (ref 5.0–7.5)

## 2022-08-04 LAB — CBC
Hematocrit: 39.2 % (ref 34.0–46.6)
Hemoglobin: 13.6 g/dL (ref 11.1–15.9)
MCH: 32.9 pg (ref 26.6–33.0)
MCHC: 34.7 g/dL (ref 31.5–35.7)
MCV: 95 fL (ref 79–97)
Platelets: 252 10*3/uL (ref 150–450)
RBC: 4.14 x10E6/uL (ref 3.77–5.28)
RDW: 12.3 % (ref 11.7–15.4)
WBC: 4.6 10*3/uL (ref 3.4–10.8)

## 2022-08-04 LAB — SODIUM, URINE, RANDOM: Sodium, Ur: 48 mmol/L

## 2022-08-04 LAB — CORTISOL-AM, BLOOD: Cortisol - AM: 12.7 ug/dL (ref 6.2–19.4)

## 2022-08-04 LAB — TSH: TSH: 1.42 u[IU]/mL (ref 0.450–4.500)

## 2022-08-21 ENCOUNTER — Encounter: Payer: Self-pay | Admitting: Cardiology

## 2022-08-24 ENCOUNTER — Encounter: Payer: Self-pay | Admitting: Family Medicine

## 2022-08-24 ENCOUNTER — Ambulatory Visit (INDEPENDENT_AMBULATORY_CARE_PROVIDER_SITE_OTHER): Payer: Commercial Managed Care - PPO | Admitting: Family Medicine

## 2022-08-24 VITALS — BP 98/62 | HR 71 | Temp 97.7°F | Ht 63.0 in | Wt 137.5 lb

## 2022-08-24 DIAGNOSIS — F900 Attention-deficit hyperactivity disorder, predominantly inattentive type: Secondary | ICD-10-CM | POA: Diagnosis not present

## 2022-08-24 DIAGNOSIS — R42 Dizziness and giddiness: Secondary | ICD-10-CM

## 2022-08-24 DIAGNOSIS — R5382 Chronic fatigue, unspecified: Secondary | ICD-10-CM | POA: Diagnosis not present

## 2022-08-24 DIAGNOSIS — F411 Generalized anxiety disorder: Secondary | ICD-10-CM

## 2022-08-24 DIAGNOSIS — R519 Headache, unspecified: Secondary | ICD-10-CM

## 2022-08-24 DIAGNOSIS — Z8659 Personal history of other mental and behavioral disorders: Secondary | ICD-10-CM

## 2022-08-24 NOTE — Progress Notes (Signed)
Subjective:    Patient ID: Ariel Ortega, female    DOB: 05/14/1996, 27 y.o.   MRN: [id number]  HPI Pt presents with c/o fatigue   Wt Readings from Last 3 Encounters:  08/24/22 137 lb 8 oz (62.4 kg)  06/30/22 133 lb 4 oz (60.4 kg)  06/18/22 134 lb (60.8 kg)   24.36 kg/m  Vitals:   08/24/22 0854  BP: 98/62  Pulse: 71  Temp: 97.7 F (36.5 C)  SpO2: 98%   Her fatigue was gone from nov to march   Then just came back for 4-5 days   Makes her feel sleepy   Gets 7-10 hours of sleep per night  Wakes up and feels ok for 1 hour -then tired  She does not nod off but feels like she could   Still some vertigo on and off   No sinus symptoms   No contraception Not trying to get pregnant  Uses condoms Get a period every month - way more normal than they used to be   Had a cardiac w/u for possible POTS (palpitations)  All normal  Had serum cortisol, cbc, bmp, tsh and ua , mag K  Zio patch-entirely normal 2D echo - normal   Mood - feels great until she has symptoms Concerta 18 mg  - used to take  The fatigue makes her feel frustrated and down  Anxiety - not anxious  ADD- not the best/not taking any medicine  Diet Limits sugar and caffeine and artificial sweetner   Exercise - tries on the days she feels good Walking  Work out videos on line  Little strength training   No longer goes to the gym (child care issue)   H/o headaches   Lab Results  Component Value Date   CREATININE 0.65 08/02/2022   BUN 9 08/02/2022   NA 141 08/02/2022   K 4.2 08/02/2022   CL 106 08/02/2022   CO2 20 08/02/2022   Lab Results  Component Value Date   ALT 10 12/07/2021   AST 15 12/07/2021   ALKPHOS 55 12/07/2021   BILITOT 0.8 12/07/2021   Lab Results  Component Value Date   WBC 4.6 08/02/2022   HGB 13.6 08/02/2022   HCT 39.2 08/02/2022   MCV 95 08/02/2022   PLT 252 08/02/2022   Lab Results  Component Value Date   IRON 93 03/08/2022   TIBC 347.2 03/08/2022   FERRITIN  15.4 03/08/2022   FERRITIN 16.3 03/08/2022   No results found for: "VITAMINB12" Lab Results  Component Value Date   TSH 1.420 08/02/2022   FT4 nl at 0.87 Am cortisol was 12.7   Nl LH, FSH and prolactin in oct  Also nl testosterone  Nl glucose in march at 85  Neg mono screen in July 2023   Ua nl in march Nl urine sodium   Patient Active Problem List   Diagnosis Date Noted   Tachycardia 03/23/2022   Lactose intolerance 03/10/2022   Exercise-induced bronchospasm 03/10/2022   Frequent headaches 01/14/2022   Encounter for routine gynecological examination 01/14/2022   Wheezing 12/07/2021   Irregular menses 08/26/2021   Fatigue 08/26/2021   Allergic rhinitis 07/22/2021   Hand paresthesia 12/14/2019   ADHD (attention deficit hyperactivity disorder), inattentive type 10/22/2019   Dizziness 09/23/2014   History of drug overdose 05/08/2014   Panic disorder 04/22/2014   History of depression 04/19/2014   GAD (generalized anxiety disorder) 12/25/2013   Panic attacks 12/25/2013   Past Medical History:  Diagnosis Date   Angio-edema    Anxiety    Asthma    Depression 04/19/2014   Mononucleosis 04/09/2011   Past Surgical History:  Procedure Laterality Date   WISDOM TOOTH EXTRACTION     Social History   Tobacco Use   Smoking status: Never   Smokeless tobacco: Never  Vaping Use   Vaping Use: Never used  Substance Use Topics   Alcohol use: No    Alcohol/week: 0.0 standard drinks of alcohol   Drug use: No   Family History  Adopted: Yes  Problem Relation Age of Onset   Depression Mother    Anxiety disorder Mother    Cancer - Prostate Father    Hyperlipidemia Father    Hypertension Father    Cancer Father        prostate cancer    Breast cancer Neg Hx    Ovarian cancer Neg Hx    Colon cancer Neg Hx    Allergic rhinitis Neg Hx    Asthma Neg Hx    Eczema Neg Hx    Urticaria Neg Hx    Allergies  Allergen Reactions   Adderall [Amphetamine-Dextroamphetamine]      Shoulder and neck pain    Effexor [Venlafaxine] Nausea And Vomiting   Milk-Related Compounds Nausea And Vomiting    Lactose intolerance   Current Outpatient Medications on File Prior to Visit  Medication Sig Dispense Refill   meclizine (ANTIVERT) 25 MG tablet Take 1 tablet (25 mg total) by mouth 3 (three) times daily as needed for dizziness. 20 tablet 1   methylphenidate 18 MG PO CR tablet Take 18 mg by mouth daily as needed (ADD, fatigue).     No current facility-administered medications on file prior to visit.    Review of Systems  Constitutional:  Positive for fatigue. Negative for activity change, appetite change, fever and unexpected weight change.  HENT:  Negative for congestion, ear pain, rhinorrhea, sinus pressure and sore throat.   Eyes:  Negative for pain, redness and visual disturbance.  Respiratory:  Negative for cough, shortness of breath and wheezing.   Cardiovascular:  Negative for chest pain and palpitations.  Gastrointestinal:  Negative for abdominal pain, blood in stool, constipation and diarrhea.  Endocrine: Negative for polydipsia and polyuria.  Genitourinary:  Negative for dysuria, frequency and urgency.  Musculoskeletal:  Negative for arthralgias, back pain and myalgias.  Skin:  Negative for pallor and rash.  Allergic/Immunologic: Negative for environmental allergies.  Neurological:  Positive for dizziness. Negative for seizures, syncope, facial asymmetry, speech difficulty, weakness, numbness and headaches.  Hematological:  Negative for adenopathy. Does not bruise/bleed easily.  Psychiatric/Behavioral:  Positive for decreased concentration. Negative for confusion, dysphoric mood, self-injury and sleep disturbance. The patient is not nervous/anxious.        Objective:   Physical Exam Constitutional:      General: She is not in acute distress.    Appearance: Normal appearance. She is well-developed and normal weight. She is not ill-appearing or diaphoretic.   HENT:     Head: Normocephalic and atraumatic.     Right Ear: Tympanic membrane and ear canal normal.     Left Ear: Tympanic membrane and ear canal normal.  Eyes:     Conjunctiva/sclera: Conjunctivae normal.     Pupils: Pupils are equal, round, and reactive to light.     Comments: No nystagmus   Neck:     Thyroid: No thyromegaly.     Vascular: No carotid bruit or JVD.  Cardiovascular:  Rate and Rhythm: Normal rate and regular rhythm.     Heart sounds: Normal heart sounds.     No gallop.  Pulmonary:     Effort: Pulmonary effort is normal. No respiratory distress.     Breath sounds: Normal breath sounds. No wheezing or rales.  Abdominal:     General: There is no distension or abdominal bruit.     Palpations: Abdomen is soft.  Musculoskeletal:     Cervical back: Normal range of motion and neck supple.     Right lower leg: No edema.     Left lower leg: No edema.  Lymphadenopathy:     Cervical: No cervical adenopathy.  Skin:    General: Skin is warm and dry.     Coloration: Skin is not pale.     Findings: No rash.  Neurological:     Mental Status: She is alert.     Coordination: Coordination normal.     Deep Tendon Reflexes: Reflexes are normal and symmetric. Reflexes normal.  Psychiatric:        Mood and Affect: Mood normal.           Assessment & Plan:   Problem List Items Addressed This Visit       Other   ADHD (attention deficit hyperactivity disorder), inattentive type    Pt has episodes of worse concentration and sleepiness/fatigue  No cataplexy   Will try taking her concerta 18 mg (she has some at home)  daily  (on days she needs it ) to see if helpful  If palpitations occur will alert us  Neg cardiac w/u is reassuring       Dizziness    Some days Intermittent  Sometimes positional   In combo with fatigue/sleepiness  Intermittent headaches  Neuro ref done      Relevant Orders   Ambulatory referral to Neurology   Fatigue - Primary     Intermittent and sometimes severe Sleepy despite adequate sleep No cataplexy  Also dizziness and some headaches  Does not snore Mood is stable ADD worse at times   Extensive work up reviewed today incl echo, zio monitor , labs, endo labs  Rev cardiology notes and results   Will try going back to her concerta prn Ref to neuro        Relevant Orders   Ambulatory referral to Neurology   Frequent headaches    On /off Better lifestyle now  No particular trigger   Also fatigue       Relevant Orders   Ambulatory referral to Neurology   GAD (generalized anxiety disorder)    Per pt no problems right now      History of depression    Per pt doing well with mood  Gets frustrated from depression

## 2022-08-24 NOTE — Assessment & Plan Note (Signed)
Per pt no problems right now

## 2022-08-24 NOTE — Patient Instructions (Addendum)
Try the concerta on days your really sleepy or need to concentrate   Continue the dietary changes  I will place a neurology referral  I put the referral in  Please let us know if you don't hear in 1-2 weeks    Take care of yourself

## 2022-08-24 NOTE — Assessment & Plan Note (Signed)
Per pt doing well with mood  Gets frustrated from depression

## 2022-08-24 NOTE — Assessment & Plan Note (Signed)
Some days Intermittent  Sometimes positional   In combo with fatigue/sleepiness  Intermittent headaches  Neuro ref done

## 2022-08-24 NOTE — Assessment & Plan Note (Signed)
Pt has episodes of worse concentration and sleepiness/fatigue  No cataplexy   Will try taking her concerta 18 mg (she has some at home)  daily  (on days she needs it ) to see if helpful  If palpitations occur will alert [place]  Neg cardiac w/u is reassuring

## 2022-08-24 NOTE — Assessment & Plan Note (Signed)
On /off Better lifestyle now  No particular trigger   Also fatigue

## 2022-08-24 NOTE — Assessment & Plan Note (Signed)
Intermittent and sometimes severe Sleepy despite adequate sleep No cataplexy  Also dizziness and some headaches  Does not snore Mood is stable ADD worse at times   Extensive work up reviewed today incl echo, zio monitor , labs, endo labs  Rev cardiology notes and results   Will try going back to her concerta prn Ref to neuro

## 2022-09-30 ENCOUNTER — Ambulatory Visit (INDEPENDENT_AMBULATORY_CARE_PROVIDER_SITE_OTHER): Payer: Commercial Managed Care - PPO | Admitting: Neurology

## 2022-09-30 ENCOUNTER — Encounter: Payer: Self-pay | Admitting: Neurology

## 2022-09-30 VITALS — BP 100/53 | HR 93 | Ht 63.0 in | Wt 135.6 lb

## 2022-09-30 DIAGNOSIS — R5383 Other fatigue: Secondary | ICD-10-CM | POA: Diagnosis not present

## 2022-09-30 NOTE — Patient Instructions (Signed)
Fatigue If you have fatigue, you feel tired all the time and have a lack of energy or a lack of motivation. Fatigue may make it difficult to start or complete tasks because of exhaustion. Occasional or mild fatigue is often a normal response to activity or life. However, long-term (chronic) or extreme fatigue may be a symptom of a medical condition such as: Depression. Not having enough red blood cells or hemoglobin in the blood (anemia). A problem with a small gland located in the lower front part of the neck (thyroid disorder). Rheumatologic conditions. These are problems related to the body's defense system (immune system). Infections, especially certain viral infections. Fatigue can also lead to negative health outcomes over time. Follow these instructions at home: Medicines Take over-the-counter and prescription medicines only as told by your health care provider. Take a multivitamin if told by your health care provider. Do not use herbal or dietary supplements unless they are approved by your health care provider. Eating and drinking  Avoid heavy meals in the evening. Eat a well-balanced diet, which includes lean proteins, whole grains, plenty of fruits and vegetables, and low-fat dairy products. Avoid eating or drinking too many products with caffeine in them. Avoid alcohol. Drink enough fluid to keep your urine pale yellow. Activity  Exercise regularly, as told by your health care provider. Use or practice techniques to help you relax, such as yoga, tai chi, meditation, or massage therapy. Lifestyle Change situations that cause you stress. Try to keep your work and personal schedules in balance. Do not use recreational or illegal drugs. General instructions Monitor your fatigue for any changes. Go to bed and get up at the same time every day. Avoid fatigue by pacing yourself during the day and getting enough sleep at night. Maintain a healthy weight. Contact a health  care provider if: Your fatigue does not get better. You have a fever. You suddenly lose or gain weight. You have headaches. You have trouble falling asleep or sleeping through the night. You feel angry, guilty, anxious, or sad. You have swelling in your legs or another part of your body. Get help right away if: You feel confused, feel like you might faint, or faint. Your vision is blurry or you have a severe headache. You have severe pain in your abdomen, your back, or the area between your waist and hips (pelvis). You have chest pain, shortness of breath, or an irregular or fast heartbeat. You are unable to urinate, or you urinate less than normal. You have abnormal bleeding from the rectum, nose, lungs, nipples, or, if you are female, the vagina. You vomit blood. You have thoughts about hurting yourself or others. These symptoms may be an emergency. Get help right away. Call 911. Do not wait to see if the symptoms will go away. Do not drive yourself to the hospital. Get help right away if you feel like you may hurt yourself or others, or have thoughts about taking your own life. Go to your nearest emergency room or: Call 911. Call the National Suicide Prevention Lifeline at 734-221-1290 or 988. This is open 24 hours a day. Text the Crisis Text Line at 925-341-0056. Summary If you have fatigue, you feel tired all the time and have a lack of energy or a lack of motivation. Fatigue may make it difficult to start or complete tasks because of exhaustion. Long-term (chronic) or extreme fatigue may be a symptom of a medical condition. Exercise regularly, as told by your health  care provider. Change situations that cause you stress. Try to keep your work and personal schedules in balance. This information is not intended to replace advice given to you by your health care provider. Make sure you discuss any questions you have with your health care provider. Document Revised: 02/23/2021 Document  Reviewed: 02/23/2021 [employer] Patient Education  2023 [employer] HST offered  , take a multivitamin- mae sure it has B- group Vitamins, , Vit D, or just prenatal vitamins   Daily exercise, hydrate well.   Prn revisit if HST is abnormal.

## 2022-09-30 NOTE — Progress Notes (Signed)
SLEEP MEDICINE CLINIC    Provider:  Melvyn Novas, MD  Primary Care Physician:  Tower, Audrie Gallus, MD [address] [place] [address]     Referring Provider: PCP   Tower, Audrie Gallus, Md [address] [place],  [place] [address]          Chief Complaint according to patient   Patient presents with:     New Patient (Initial Visit)           HISTORY OF PRESENT ILLNESS:  Ariel Ortega is a 27 y.o. female patient who is seen upon referral on 09/30/2022 from PCP. Chief concern according to patient :  Onset of fatigue  in February 2023, not related to an infection"    I have the pleasure of seeing Ariel Ortega 09/30/22 a left-handed female with fatigue, here to be evaluated for a possible sleep disorder.  She has good days and bad days, fatigue is not always present and sleep is of good quality. Fatigue spells 1-2 times a month, sometimes lasting 1 day, sometimes 3 days . On days with fatigue, the degree is limiting, and disabling, she feels cognitively slowed and is afraid of driving. She is a stay at home mother.     Sleep relevant medical history:  Nocturia:  1-2 . No snoring. No witnessed apneas, no sleep choking.    Family medical /sleep history: NO other family member on CPAP with OSA. Mother was adopted.  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  Tobacco use; none .  ETOH use ; none ,  One cup a day of  Coffee, no Soda( /) Tea ( /) nor energy drinks Exercise in form of walking , work out at home.        Sleep habits are as follows: The patient's dinner time is between 5 PM. The patient goes to bed at 10 PM after reading ( not on a device)  and continues to sleep for 6-7 hours, wakes for 1-2 bathroom breaks, the first time at 3 AM.   The preferred sleep position is laterally, with the support of 1-2 pillows.   Dreams are reportedly infrequent.  The patient wakes up spontaneously or is woken by her daughter  No sleep inertia- 6.45  AM is the usual rise time. She reports not feeling refreshed or restored in AM, fatigue escalates over the first 30 minutes, Naps are taken infrequently, lasting from 2-3  hours - and less refreshing than nocturnal sleep. She has palpitations, head pressure- feels just poorly.    Review of Systems: Out of a complete 14 system review, the patient complains of only the following symptoms, and all other reviewed systems are negative.:  Cyclic Fatigue, sleepiness ,   No RLS rare headaches.  How likely are you to doze in the following situations: 0 = not likely, 1 = slight chance, 2 = moderate chance, 3 = high chance   Sitting and Reading? Watching Television? Sitting inactive in a public place (theater or meeting)? As a passenger in a car for an hour without a break? Lying down in the afternoon when circumstances permit? Sitting and talking to someone? Sitting quietly after lunch without alcohol? In a car, while stopped for a few minutes in traffic?   Total =  9/ 24 points   FSS endorsed at 56/ 63 points.    Fatigue was not present in pregnancy and not related to any known factor.   Social History   Socioeconomic History   Marital status: Married    Spouse name: Not on file   Number of children: Not on file   Years of education: Not on file   Highest education level: Not on file  Occupational History   Not on file  Tobacco Use   Smoking status: Never   Smokeless tobacco: Never  Vaping Use   Vaping Use: Never used  Substance and Sexual Activity   Alcohol use: No    Alcohol/week: 0.0 standard drinks of alcohol   Drug use: No   Sexual activity: Yes    Birth control/protection: I.U.D.  Other Topics Concern   Not on file  Social History Narrative   ** Merged History Encounter **       Social Determinants of Health   Financial Resource Strain: Not on  file  Food Insecurity: Not on file  Transportation Needs: Not on file  Physical Activity: Not on file  Stress: Not on file  Social Connections: Not on file    Family History  Adopted: Yes  Problem Relation Age of Onset   Depression Mother    Anxiety disorder Mother    Cancer - Prostate Father    Hyperlipidemia Father    Hypertension Father    Cancer Father        prostate cancer    Breast cancer Neg Hx    Ovarian cancer Neg Hx    Colon cancer Neg Hx    Allergic rhinitis Neg Hx    Asthma Neg Hx    Eczema Neg Hx    Urticaria Neg Hx     Past Medical History:  Diagnosis Date   Angio-edema    Anxiety    Asthma    Depression 04/19/2014   Mononucleosis 04/09/2011    Past Surgical History:  Procedure Laterality Date   WISDOM TOOTH EXTRACTION       Current Outpatient Medications on File Prior to Visit  Medication Sig Dispense Refill   EPINEPHrine 0.3 mg/0.3 mL IJ SOAJ injection Inject 0.3 mg into the muscle as needed.     meclizine (ANTIVERT) 25 MG tablet Take 1 tablet (25 mg total) by mouth 3 (three) times daily as needed for dizziness. 20 tablet 1   methylphenidate 18 MG PO CR tablet Take 18 mg by mouth daily as needed (ADD, fatigue).     No current facility-administered medications on file prior to visit.    Allergies  Allergen Reactions   Adderall [Amphetamine-Dextroamphetamine]     Shoulder and neck pain    Effexor [Venlafaxine] Nausea And Vomiting   Milk-Related Compounds Nausea And Vomiting    Lactose intolerance     DIAGNOSTIC DATA (LABS, IMAGING, TESTING) - I reviewed patient records, labs, notes, testing and imaging myself where available.  Lab Results  Component Value Date   WBC 4.6 08/02/2022   HGB 13.6 08/02/2022   HCT 39.2 08/02/2022   MCV 95 08/02/2022   PLT 252 08/02/2022      Component Value Date/Time   NA 141 08/02/2022 0811   NA 140 04/19/2014 0159   K 4.2 08/02/2022 0817   K 3.7 04/19/2014 0159   CL 106 08/02/2022 0811   CL 105  04/19/2014 0159   CO2 20 08/02/2022 0811   CO2 26 (H) 04/19/2014 0159   GLUCOSE  85 08/02/2022 0811   GLUCOSE 62 (L) 12/07/2021 1610   GLUCOSE 112 (H) 04/19/2014 0159   BUN 9 08/02/2022 0811   BUN 12 04/19/2014 0159   CREATININE 0.65 08/02/2022 0811   CREATININE 0.69 04/19/2014 0159   CALCIUM 9.5 08/02/2022 0811   CALCIUM 9.4 04/19/2014 0159   PROT 6.8 12/07/2021 1610   PROT 7.3 04/19/2014 0159   ALBUMIN 4.4 12/07/2021 1610   ALBUMIN 3.9 04/19/2014 0159   AST 15 12/07/2021 1610   AST 15 04/19/2014 0159   ALT 10 12/07/2021 1610   ALT 20 04/19/2014 0159   ALKPHOS 55 12/07/2021 1610   ALKPHOS 58 04/19/2014 0159   BILITOT 0.8 12/07/2021 1610   BILITOT 1.3 (H) 04/19/2014 0159   GFRNONAA >60 01/27/2017 2044   GFRAA >60 01/27/2017 2044   Lab Results  Component Value Date   CHOL 141 04/20/2014   HDL 70 04/20/2014   LDLCALC 63 04/20/2014   TRIG 39 04/20/2014   CHOLHDL 2.0 04/20/2014   Lab Results  Component Value Date   HGBA1C 5.1 04/20/2014   No results found for: "VITAMINB12" Lab Results  Component Value Date   TSH 1.420 08/02/2022    PHYSICAL EXAM:  Today's Vitals   09/30/22 1352  BP: (!) 100/53  Pulse: 93  Weight: 135 lb 9.6 oz (61.5 kg)  Height: 5\' 3"  (1.6 m)   Body mass index is 24.02 kg/m.   Wt Readings from Last 3 Encounters:  09/30/22 135 lb 9.6 oz (61.5 kg)  08/24/22 137 lb 8 oz (62.4 kg)  06/30/22 133 lb 4 oz (60.4 kg)     Ht Readings from Last 3 Encounters:  09/30/22 5\' 3"  (1.6 m)  08/24/22 5\' 3"  (1.6 m)  06/30/22 5\' 3"  (1.6 m)      General: The patient is awake, alert and appears not in acute distress. The patient is well groomed. Head: Normocephalic, atraumatic.  Neck is supple. Mallampati 1-2,  neck circumference:13 inches .  Nasal airflow patent.  Retrognathia is not seen.  Dental status: biological Cardiovascular:  Regular rate and cardiac rhythm by pulse,  without distended neck veins. Respiratory: Lungs are clear to auscultation.   Skin:  Without any evidence of ankle edema, or rash. Trunk: The patient's posture is erect.   NEUROLOGIC EXAM: The patient is awake and alert, oriented to place and time.   Memory subjective described as intact.  Attention span & concentration ability appears normal.  Speech is fluent,  without dysarthria, dysphonia or aphasia.  Mood and affect are appropriate.   Cranial nerves: no loss of smell or taste reported  Pupils are equal and briskly reactive to light. Funduscopic exam deferred..  Extraocular movements in vertical and horizontal planes were intact and without nystagmus. No Diplopia. Visual fields by finger perimetry are intact. Hearing was intact to soft voice and finger rubbing.    Facial sensation intact to fine touch.  Facial motor strength is symmetric and tongue and uvula move midline.  Neck ROM : rotation, tilt and flexion extension were normal for age and shoulder shrug was symmetrical.    Motor exam:  Symmetric bulk, tone and ROM.   Normal tone without cog wheeling, symmetric grip strength .   Sensory:  Fine touch, pinprick and vibration were tested  and  normal.  Proprioception tested in the upper extremities was normal.   Coordination: Rapid alternating movements in the fingers/hands were of normal speed.  The Finger-to-nose maneuver was intact without evidence of ataxia, dysmetria. There  is mild action tremor.   Gait and station: Patient could rise unassisted from a seated position, walked without assistive device.  Stance is of normal width/ base and the patient turned with 3 steps.  Toe and heel walk were deferred.  Deep tendon reflexes: in the  upper and lower extremities are symmetric and intact.  Babinski response was deferred .    ASSESSMENT AND PLAN 27  year- old female  patient and mother of a 37 year -old daughter, presenting here with: Unexplained cyclic fatigue, referral to evaluate for organic sleep disorder as a possible contributing factor.   Referral was initiated by primary care.  The patient is presenting with a history of ADHD and had in the past used Concerta which is a long-acting form of Ritalin.  She has weaned herself off this medication- She uses it now sometimes to help with the excessive fatigue.    Fatigue may affect her for  3 to 7 days out of the month.  There has been no predictable pattern and no identified trigger.   There is no associated pain.  She is not febrile she does not have chest pain or shortness of breath. MONONucleosis: 12 years ago.   I reviewed the patient's laboratory tests which included iron and total iron binding capacity, she had a remarkably low ferritin of 16.3 but no associated restless leg syndrome, the saturation of iron was 26.8, transferrin was low normal. TSH norla .   She has also undergone LH and FSH testing these were in normal range for her age and the ovulation phase. Same for Estrodial, Progesterone.  Normal prolactin.  Normal BMET 08-02-2022.  Cardiology normal - low BP, 100/ 53 mmHG.     1) I will offer a home sleep test just to make sure we are not missing some form of sleep apnea or nocturnal hypoxia but I am not optimistic that this will have a high yield.  I do not think that this is an organic sleep disorder leading to chronic fatigue.  The patient does not have any pain or discomfort otherwise a rheumatological workup would be indicated for his chronic fatigue, she does not suffer from acute anxiety or depression which could also lead to fatigue.  There has been no specific stressor no loss of job or grieving.   HST offered  , take a multivitamin- mae sure it has B- group Vitamins, , Vit D, or just prenatal vitamins   Daily exercise, hydrate well.     I would like to thank Tower, Audrie Gallus, MD and [place], [place], [place] [address] [place],  [place] [address] for allowing me to meet with and to take care of this pleasant patient.    After spending a total time of  40   minutes face to face and additional time for physical and neurologic examination, review of laboratory studies,  personal review of imaging studies, reports and results of other testing and review of referral information / records as far as provided in visit,   Electronically signed by: Melvyn Novas, MD 09/30/2022 2:29 PM  [facility] Board certified by The [employer] of Sleep Medicine and Diplomate of the [employer] of Sleep Medicine. Board certified In Neurology through the ABPN, Fellow of the [employer] of Neu[employer]. Medical Director of [facility].

## 2022-10-08 ENCOUNTER — Other Ambulatory Visit: Payer: Self-pay

## 2022-10-08 ENCOUNTER — Other Ambulatory Visit: Payer: Self-pay | Admitting: Family Medicine

## 2022-10-08 MED ORDER — METHYLPHENIDATE HCL ER (OSM) 18 MG PO TBCR
18.0000 mg | EXTENDED_RELEASE_TABLET | Freq: Every day | ORAL | 0 refills | Status: DC | PRN
  Filled 2022-10-08: qty 30, 30d supply, fill #0

## 2022-10-08 NOTE — Telephone Encounter (Signed)
On med list as a historical entry but last filled info below:  Name of Medication: Methylphenidate Name of Pharmacy: [facility] Last Fill or Written Date and Quantity: 01/22/22 #30 tab/ 0 refills  Last Office Visit and Type: fatigue on 08/24/22 Next Office Visit and Type: none scheduled

## 2022-10-11 ENCOUNTER — Other Ambulatory Visit: Payer: Self-pay

## 2022-10-12 ENCOUNTER — Other Ambulatory Visit: Payer: Self-pay

## 2022-10-20 ENCOUNTER — Ambulatory Visit: Payer: Commercial Managed Care - PPO | Admitting: Family Medicine

## 2022-10-26 ENCOUNTER — Telehealth: Payer: Self-pay | Admitting: Neurology

## 2022-10-26 NOTE — Telephone Encounter (Signed)
10/26/22 LVM [place]  10/25/22 UMR no Berkley Harvey req ref # Jenn on 10/25/22 EE

## 2022-11-12 ENCOUNTER — Other Ambulatory Visit: Payer: Self-pay | Admitting: Family Medicine

## 2022-11-14 ENCOUNTER — Other Ambulatory Visit: Payer: Self-pay | Admitting: Family Medicine

## 2022-11-14 ENCOUNTER — Other Ambulatory Visit: Payer: Self-pay

## 2022-11-15 ENCOUNTER — Other Ambulatory Visit: Payer: Self-pay

## 2022-11-15 MED FILL — Methylphenidate HCl Tab ER Osmotic Release (OSM) 18 MG: ORAL | 30 days supply | Qty: 30 | Fill #0 | Status: AC

## 2022-11-15 NOTE — Telephone Encounter (Signed)
Name of Medication: Methylphenidate Name of Pharmacy: [facility] Last Fill or Written Date and Quantity: 10/08/22 #30 tab/ 0 refills  Last Office Visit and Type: fatigue on 08/24/22 Next Office Visit and Type: none scheduled

## 2022-11-16 ENCOUNTER — Other Ambulatory Visit: Payer: Self-pay

## 2022-12-17 ENCOUNTER — Encounter: Payer: Self-pay | Admitting: Family Medicine

## 2022-12-17 ENCOUNTER — Ambulatory Visit (INDEPENDENT_AMBULATORY_CARE_PROVIDER_SITE_OTHER): Payer: BLUE CROSS/BLUE SHIELD | Admitting: Family Medicine

## 2022-12-17 VITALS — BP 102/70 | HR 63 | Temp 98.7°F | Ht 63.0 in | Wt 133.1 lb

## 2022-12-17 DIAGNOSIS — F900 Attention-deficit hyperactivity disorder, predominantly inattentive type: Secondary | ICD-10-CM

## 2022-12-17 DIAGNOSIS — R4589 Other symptoms and signs involving emotional state: Secondary | ICD-10-CM | POA: Diagnosis not present

## 2022-12-17 DIAGNOSIS — Z8659 Personal history of other mental and behavioral disorders: Secondary | ICD-10-CM

## 2022-12-17 NOTE — Patient Instructions (Signed)
Hold your ADHD medicine for a week   See how your mood is and update [place] Then we will make a plan   If the medicine is not the problem then we may consider generic prozac for depression and seasonal affective disorder   If you want to talk to a counselor let us know  Keep exercising    If much worse / or suicidal thoughts go to the ER

## 2022-12-17 NOTE — Progress Notes (Unsigned)
Subjective:    Patient ID: Ariel Ortega, female    DOB: February 16, 1996, 27 y.o.   MRN: [id number]  HPI  Wt Readings from Last 3 Encounters:  12/17/22 133 lb 2 oz (60.4 kg)  09/30/22 135 lb 9.6 oz (61.5 kg)  08/24/22 137 lb 8 oz (62.4 kg)   23.58 kg/m  Vitals:   12/17/22 1428  BP: 102/70  Pulse: 63  Temp: 98.7 F (37.1 C)  SpO2: 98%   Pt presents with change in mood   More down lately  Irritable at times  Less stress than a year ago  Less motivation  Does not enjoy things as much   No worse time of day   Not very anxious   In the past treating ADHD helped her mood   On methylphenidate since before April  Taking it for both fatigue and ADHD  Does not skip days  ? If depressed mood correlates with this   She hates summer - in general gets seasonal depresssion  No SI     Vyvanse caued headaches  Adderall caused muscle pains   Past :  Paxil  Prozac  Intol of effexor   Saw psychiatry in past  History of drug overdose   Exercising regularly - walks and work out videos   Appetite- fair / self medicates a bit with food  Sleep-normal      Patient Active Problem List   Diagnosis Date Noted   Tachycardia 03/23/2022   Lactose intolerance 03/10/2022   Exercise-induced bronchospasm 03/10/2022   Frequent headaches 01/14/2022   Encounter for routine gynecological examination 01/14/2022   Wheezing 12/07/2021   Irregular menses 08/26/2021   Fatigue 08/26/2021   Allergic rhinitis 07/22/2021   Hand paresthesia 12/14/2019   ADHD (attention deficit hyperactivity disorder), inattentive type 10/22/2019   Dizziness 09/23/2014   History of drug overdose 05/08/2014   Panic disorder 04/22/2014   History of depression 04/19/2014   GAD (generalized anxiety disorder) 12/25/2013   Panic attacks 12/25/2013   Past Medical History:  Diagnosis Date   Angio-edema    Anxiety    Asthma    Depression 04/19/2014   Mononucleosis 04/09/2011   Past Surgical History:   Procedure Laterality Date   WISDOM TOOTH EXTRACTION     Social History   Tobacco Use   Smoking status: Never   Smokeless tobacco: Never  Vaping Use   Vaping status: Never Used  Substance Use Topics   Alcohol use: No    Alcohol/week: 0.0 standard drinks of alcohol   Drug use: No   Family History  Adopted: Yes  Problem Relation Age of Onset   Depression Mother    Anxiety disorder Mother    Cancer - Prostate Father    Hyperlipidemia Father    Hypertension Father    Cancer Father        prostate cancer    Breast cancer Neg Hx    Ovarian cancer Neg Hx    Colon cancer Neg Hx    Allergic rhinitis Neg Hx    Asthma Neg Hx    Eczema Neg Hx    Urticaria Neg Hx    Allergies  Allergen Reactions   Adderall [Amphetamine-Dextroamphetamine]     Shoulder and neck pain    Effexor [Venlafaxine] Nausea And Vomiting   Milk-Related Compounds Nausea And Vomiting    Lactose intolerance   Current Outpatient Medications on File Prior to Visit  Medication Sig Dispense Refill   EPINEPHrine 0.3 mg/0.3 mL IJ SOAJ  injection Inject 0.3 mg into the muscle as needed.     meclizine (ANTIVERT) 25 MG tablet Take 1 tablet (25 mg total) by mouth 3 (three) times daily as needed for dizziness. 20 tablet 1   methylphenidate 18 MG PO CR tablet Take 1 tablet (18 mg total) by mouth daily as needed (ADD, fatigue). 30 tablet 0   No current facility-administered medications on file prior to visit.    Review of Systems     Objective:   Physical Exam        Assessment & Plan:   Problem List Items Addressed This Visit   None

## 2022-12-18 ENCOUNTER — Encounter: Payer: Self-pay | Admitting: Family Medicine

## 2022-12-18 NOTE — Assessment & Plan Note (Signed)
This may be side effect of her methylphenidate  Also history of depression in the past   Has history of SAD with summer time depression as well   Instructed to hold her ADHD med 1 week Then report back  If not improved consider trial of prozac  Declines counseling/no new stressors   Reviewed stressors/ coping techniques/symptoms/ support sources/ tx options and side effects in detail today  Call back and Er precautions noted in detail today  No SI at all  Of note past history of intentional drug overdose so need to watch this closely

## 2022-12-18 NOTE — Assessment & Plan Note (Signed)
Taking methlyphenidate and it is effective but ? If causing depressed mood and irritability  In past intolerant of vyvanse and adderall  Instructed to hold medication for 1 week Then call/message to report on mood  If worse in the meantime instructed to call

## 2022-12-26 ENCOUNTER — Encounter: Payer: Self-pay | Admitting: Family Medicine

## 2022-12-28 MED ORDER — ATOMOXETINE HCL 40 MG PO CAPS
40.0000 mg | ORAL_CAPSULE | Freq: Every day | ORAL | 0 refills | Status: AC
Start: 2022-12-28 — End: ?

## 2022-12-30 MED ORDER — BUSPIRONE HCL 15 MG PO TABS
7.5000 mg | ORAL_TABLET | Freq: Two times a day (BID) | ORAL | 1 refills | Status: DC
Start: 2022-12-30 — End: 2023-01-18

## 2022-12-30 NOTE — Addendum Note (Signed)
Addended by: Roxy Manns A on: 12/30/2022 01:00 PM   Modules accepted: Orders

## 2023-01-18 MED ORDER — FLUOXETINE HCL 10 MG PO CAPS
10.0000 mg | ORAL_CAPSULE | Freq: Every day | ORAL | 1 refills | Status: DC
Start: 2023-01-18 — End: 2023-02-24

## 2023-01-18 NOTE — Addendum Note (Signed)
Addended by: Roxy Manns A on: 01/18/2023 08:27 PM   Modules accepted: Orders

## 2023-02-24 ENCOUNTER — Encounter: Payer: Self-pay | Admitting: Family Medicine

## 2023-02-24 ENCOUNTER — Other Ambulatory Visit: Payer: Self-pay

## 2023-02-24 ENCOUNTER — Ambulatory Visit: Payer: Self-pay | Admitting: Family Medicine

## 2023-02-24 VITALS — BP 92/60 | HR 60 | Temp 98.3°F | Ht 63.0 in | Wt 135.0 lb

## 2023-02-24 DIAGNOSIS — Z23 Encounter for immunization: Secondary | ICD-10-CM

## 2023-02-24 DIAGNOSIS — F900 Attention-deficit hyperactivity disorder, predominantly inattentive type: Secondary | ICD-10-CM | POA: Diagnosis not present

## 2023-02-24 DIAGNOSIS — F411 Generalized anxiety disorder: Secondary | ICD-10-CM

## 2023-02-24 DIAGNOSIS — R4589 Other symptoms and signs involving emotional state: Secondary | ICD-10-CM

## 2023-02-24 MED ORDER — FLUOXETINE HCL 10 MG PO CAPS
10.0000 mg | ORAL_CAPSULE | Freq: Every day | ORAL | 3 refills | Status: DC
  Filled 2023-02-24: qty 90, 90d supply, fill #0

## 2023-02-24 MED ORDER — METHYLPHENIDATE HCL ER (OSM) 18 MG PO TBCR
18.0000 mg | EXTENDED_RELEASE_TABLET | Freq: Every day | ORAL | 0 refills | Status: DC
  Filled 2023-02-24: qty 30, 30d supply, fill #0

## 2023-02-24 NOTE — Assessment & Plan Note (Signed)
Doing much better with prozac 10 mg daily and change in season  Reviewed stressors/ coping techniques/symptoms/ support sources/ tx options and side effects in detail today   Will stay on this dose and update if she feels she needs to increase No side eff  Will continue this Will also try concerta again for ADHD and report back   Instructed to call if side eff

## 2023-02-24 NOTE — Assessment & Plan Note (Signed)
Better with fluoxetine 10 mg  Also change in seasons and outdoor time / self care   Plan to continue this

## 2023-02-24 NOTE — Assessment & Plan Note (Signed)
Pt would like to try concerta again now that mood is better with fluoxetine and change in season  Encouraged good organization / self care Sent in 18 mg to try and will report back  Checked state database-last refill was in July

## 2023-02-24 NOTE — Patient Instructions (Addendum)
Take care of yourself  Get outdoors  Get exercise   Continue the fluoxetine at current dose  Try the concerta again and let us know how it goes

## 2023-02-24 NOTE — Progress Notes (Signed)
Subjective:    Patient ID: Ariel Ortega, female    DOB: 02/10/1996, 27 y.o.   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 , then tried fluoxetine 10 mg daily   Doing better Fluoxetine is helping   Change in season really helps   Concentration - worse when a lot going on  Wants to go back to work next fall  More apt to make little mistakes    Patient Active Problem List   Diagnosis Date Noted   Depressed mood 12/17/2022   Tachycardia 03/23/2022   Lactose intolerance 03/10/2022   Exercise-induced bronchospasm 03/10/2022   Frequent headaches 01/14/2022   Encounter for routine gynecological examination 01/14/2022   Wheezing 12/07/2021   Irregular menses 08/26/2021   Fatigue 08/26/2021   Allergic rhinitis 07/22/2021   Hand paresthesia 12/14/2019   ADHD (attention deficit hyperactivity disorder), inattentive type 10/22/2019   Dizziness 09/23/2014   History of drug overdose 05/08/2014   Panic disorder 04/22/2014   History of depression 04/19/2014   GAD (generalized anxiety disorder) 12/25/2013   Panic attacks 12/25/2013   Past Medical History:  Diagnosis Date   Angio-edema    Anxiety    Asthma    Depression 04/19/2014   Mononucleosis 04/09/2011   Past Surgical History:  Procedure Laterality Date   WISDOM TOOTH EXTRACTION     Social History   Tobacco Use   Smoking status: Never   Smokeless tobacco: Never   Vaping Use   Vaping status: Never Used  Substance Use Topics   Alcohol use: No    Alcohol/week: 0.0 standard drinks of alcohol   Drug use: No   Family History  Adopted: Yes  Problem Relation Age of Onset   Depression Mother    Anxiety disorder Mother    Cancer - Prostate Father    Hyperlipidemia Father    Hypertension Father    Cancer Father        prostate cancer    Breast cancer Neg Hx    Ovarian cancer Neg Hx    Colon cancer Neg Hx    Allergic rhinitis Neg Hx    Asthma Neg Hx    Eczema Neg Hx    Urticaria Neg Hx    Allergies  Allergen Reactions   Adderall [Amphetamine-Dextroamphetamine]     Shoulder and neck pain    Effexor [Venlafaxine] Nausea And Vomiting   Milk-Related Compounds Nausea And Vomiting    Lactose intolerance   Current Outpatient Medications on File Prior to Visit  Medication Sig Dispense Refill   EPINEPHrine 0.3 mg/0.3 mL IJ SOAJ injection Inject 0.3 mg into the muscle as needed.     meclizine (ANTIVERT) 25 MG tablet Take 1 tablet (25 mg total) by mouth 3 (three) times daily as needed for dizziness. 20 tablet 1  No current facility-administered medications on file prior to visit.    Review of Systems  Constitutional:  Negative for activity change, appetite change, fatigue, fever and unexpected weight change.  HENT:  Negative for congestion, ear pain, rhinorrhea, sinus pressure and sore throat.   Eyes:  Negative for pain, redness and visual disturbance.  Respiratory:  Negative for cough, shortness of breath and wheezing.   Cardiovascular:  Negative for chest pain and palpitations.  Gastrointestinal:  Negative for abdominal pain, blood in stool, constipation and diarrhea.  Endocrine: Negative for polydipsia and polyuria.  Genitourinary:  Negative for dysuria, frequency and urgency.  Musculoskeletal:  Negative for arthralgias, back pain and myalgias.  Skin:  Negative for pallor and rash.  Allergic/Immunologic: Negative for environmental  allergies.  Neurological:  Negative for dizziness, syncope and headaches.  Hematological:  Negative for adenopathy. Does not bruise/bleed easily.  Psychiatric/Behavioral:  Positive for decreased concentration. Negative for confusion and dysphoric mood. The patient is not nervous/anxious.        Mood is much improved       Objective:   Physical Exam Constitutional:      General: She is not in acute distress.    Appearance: Normal appearance. She is well-developed and normal weight. She is not ill-appearing or diaphoretic.  HENT:     Head: Normocephalic and atraumatic.  Eyes:     Conjunctiva/sclera: Conjunctivae normal.     Pupils: Pupils are equal, round, and reactive to light.  Neck:     Thyroid: No thyromegaly.     Vascular: No carotid bruit or JVD.  Cardiovascular:     Rate and Rhythm: Normal rate and regular rhythm.     Heart sounds: Normal heart sounds.     No gallop.  Pulmonary:     Effort: Pulmonary effort is normal. No respiratory distress.     Breath sounds: Normal breath sounds. No wheezing or rales.  Abdominal:     General: There is no distension or abdominal bruit.     Palpations: Abdomen is soft.  Musculoskeletal:     Cervical back: Normal range of motion and neck supple.     Right lower leg: No edema.     Left lower leg: No edema.  Lymphadenopathy:     Cervical: No cervical adenopathy.  Skin:    General: Skin is warm and dry.     Coloration: Skin is not pale.     Findings: No rash.  Neurological:     Mental Status: She is alert.     Cranial Nerves: No cranial nerve deficit.     Motor: No weakness or tremor.     Coordination: Coordination normal.     Deep Tendon Reflexes: Reflexes are normal and symmetric. Reflexes normal.  Psychiatric:        Mood and Affect: Mood normal.           Assessment & Plan:   Problem List Items Addressed This Visit       Other   ADHD (attention deficit hyperactivity disorder), inattentive type - Primary    Pt would  like to try concerta again now that mood is better with fluoxetine and change in season  Encouraged good organization / self care Sent in 18 mg to try and will report back  Checked state database-last refill was in July        Depressed mood    Doing much better with prozac 10 mg daily and change in season  Reviewed stressors/ coping techniques/symptoms/ support  sources/ tx options and side effects in detail today   Will stay on this dose and update if she feels she needs to increase No side eff  Will continue this Will also try concerta again for ADHD and report back   Instructed to call if side eff      GAD (generalized anxiety disorder)    Better with fluoxetine 10 mg  Also change in seasons and outdoor time / self care   Plan to continue this       Relevant Medications   FLUoxetine (PROZAC) 10 MG capsule   Other Visit Diagnoses     Need for influenza vaccination       Relevant Orders   Flu vaccine trivalent PF, 6mos and older(Flulaval,Afluria,Fluarix,Fluzone) (Completed)

## 2023-03-08 ENCOUNTER — Encounter: Payer: Self-pay | Admitting: Family Medicine

## 2023-03-08 ENCOUNTER — Telehealth (INDEPENDENT_AMBULATORY_CARE_PROVIDER_SITE_OTHER): Payer: Self-pay | Admitting: Family Medicine

## 2023-03-08 DIAGNOSIS — H5789 Other specified disorders of eye and adnexa: Secondary | ICD-10-CM

## 2023-03-08 NOTE — Progress Notes (Signed)
Virtual Visit via Video Note  I connected with Ariel Ortega on 03/08/23 at 12:00 PM EDT by a video enabled telemedicine application and verified that I am speaking with the correct person using two identifiers.  Location: Patient: home Provider: office    I discussed the limitations of evaluation and management by telemedicine and the availability of in person appointments. The patient expressed understanding and agreed to proceed.  Parties involved in encounter  Patient:  Provider:  Roxy Manns MD   History of Present Illness: Pt presents with eye symptoms   On Friday -pain in eyelids - pressure sensation / just upper  They got puffy  No redness No scale or rash   Eyes got itchy in inner corners -later that day  Eyes look glassy  Not very red at all   Some discharge from eyes Some crust in the inner corners - in the am   Wears contacts    Stopped them when this started  Threw them out/ dailies   No uri symptoms at all  No fever   Some outdoor time   No new products   Took benadryl 2 nights in a row   Some warm compresses   Has pataday  Uses at night -typically every day  Stopped it because it was burning   [facility]    Patient Active Problem List   Diagnosis Date Noted   Eye irritation 03/08/2023   Depressed mood 12/17/2022   Tachycardia 03/23/2022   Lactose intolerance 03/10/2022   Exercise-induced bronchospasm 03/10/2022   Frequent headaches 01/14/2022   Encounter for routine gynecological examination 01/14/2022   Wheezing 12/07/2021   Irregular menses 08/26/2021   Fatigue 08/26/2021   Allergic rhinitis 07/22/2021   Hand paresthesia 12/14/2019   ADHD (attention deficit hyperactivity disorder), inattentive type 10/22/2019   Dizziness 09/23/2014   History of drug overdose 05/08/2014   Panic disorder 04/22/2014   History of depression 04/19/2014   GAD (generalized anxiety disorder) 12/25/2013   Panic  attacks 12/25/2013   Past Medical History:  Diagnosis Date   Angio-edema    Anxiety    Asthma    Depression 04/19/2014   Mononucleosis 04/09/2011   Past Surgical History:  Procedure Laterality Date   WISDOM TOOTH EXTRACTION     Social History   Tobacco Use   Smoking status: Never   Smokeless tobacco: Never  Vaping Use   Vaping status: Never Used  Substance Use Topics   Alcohol use: No    Alcohol/week: 0.0 standard drinks of alcohol   Drug use: No   Family History  Adopted: Yes  Problem Relation Age of Onset   Depression Mother    Anxiety disorder Mother    Cancer - Prostate Father    Hyperlipidemia Father    Hypertension Father    Cancer Father        prostate cancer    Breast cancer Neg Hx    Ovarian cancer Neg Hx    Colon cancer Neg Hx    Allergic rhinitis Neg Hx    Asthma Neg Hx    Eczema Neg Hx    Urticaria Neg Hx    Allergies  Allergen Reactions   Adderall [Amphetamine-Dextroamphetamine]     Shoulder and neck pain    Effexor [Venlafaxine] Nausea And Vomiting   Milk-Related Compounds Nausea And Vomiting    Lactose intolerance   Current Outpatient Medications on File Prior to Visit  Medication Sig Dispense Refill  EPINEPHrine 0.3 mg/0.3 mL IJ SOAJ injection Inject 0.3 mg into the muscle as needed.     FLUoxetine (PROZAC) 10 MG capsule Take 1 capsule (10 mg total) by mouth daily. 90 capsule 3   meclizine (ANTIVERT) 25 MG tablet Take 1 tablet (25 mg total) by mouth 3 (three) times daily as needed for dizziness. 20 tablet 1   methylphenidate (CONCERTA) 18 MG PO CR tablet Take 1 tablet (18 mg total) by mouth daily. 30 tablet 0   No current facility-administered medications on file prior to visit.   Review of Systems  Constitutional:  Negative for fever.  HENT:  Negative for congestion and sinus pain.   Eyes:  Positive for discharge. Negative for blurred vision, photophobia and pain.       A little eye redness in inner corners with some crust in  am Eyes itch /no pain   Neurological:  Negative for headaches.    Observations/Objective:  Patient appears well, in no distress Weight is baseline  No facial swelling or asymmetry On camera no appreciable eye redness or obvious swelling (but limited eval since pt is not in office)  Eoms intact  Pt does not rub or touch eyes during the visit   Normal voice-not hoarse and no slurred speech No obvious tremor or mobility impairment Moving neck and UEs normally Able to hear the call well  No cough or shortness of breath during interview  Talkative and mentally sharp with no cognitive changes No skin changes on face or neck , no rash or pallor Affect is normal   Assessment and Plan: Problem List Items Addressed This Visit       Other   Eye irritation - Primary    Mild with some erythema (per pt) in inner corners and crust in am  No other symptoms  No vision change Limited eval being virtual today   Most consistent with allergic conjunctivitis  Recommend she follow up with her eye specialist since pataday is not working/ causing burning sensation  See AVS- recommend washing hands / watch for increase redness or vision change  Try cool compress Call back and Er precautions noted in detail today  Update if not starting to improve in a week or if worsening          Follow Up Instructions: Use a cold compress on eyes instead of warm  You can try pataday one more time-if it burns avoid it  Wash hands frequently and try not to touch eyes  Avoid contact lenses until this improves   Call your eye doctor office and get an appointment to be seen / checked out   In the meantime if symptoms worsen or your vision changes let us know    I discussed the assessment and treatment plan with the patient. The patient was provided an opportunity to ask questions and all were answered. The patient agreed with the plan and demonstrated an understanding of the instructions.   The patient  was advised to call back or seek an in-person evaluation if the symptoms worsen or if the condition fails to improve as anticipated.     Roxy Manns, MD

## 2023-03-08 NOTE — Assessment & Plan Note (Addendum)
Mild with some erythema (per pt) in inner corners and crust in am  No other symptoms  No vision change Limited eval being virtual today   Most consistent with allergic conjunctivitis  Recommend she follow up with her eye specialist since pataday is not working/ causing burning sensation  See AVS- recommend washing hands / watch for increase redness or vision change  Try cool compress Call back and Er precautions noted in detail today  Update if not starting to improve in a week or if worsening

## 2023-03-08 NOTE — Patient Instructions (Signed)
Use a cold compress on eyes instead of warm  You can try pataday one more time-if it burns avoid it  Wash hands frequently and try not to touch eyes  Avoid contact lenses until this improves   Call your eye doctor office and get an appointment to be seen / checked out   In the meantime if symptoms worsen or your vision changes let us know

## 2023-03-10 DIAGNOSIS — H04123 Dry eye syndrome of bilateral lacrimal glands: Secondary | ICD-10-CM | POA: Diagnosis not present

## 2023-04-03 ENCOUNTER — Encounter: Payer: Self-pay | Admitting: Family Medicine

## 2023-04-04 MED ORDER — FLUOXETINE HCL 20 MG PO CAPS
20.0000 mg | ORAL_CAPSULE | Freq: Every day | ORAL | 3 refills | Status: DC
  Filled 2023-04-04 – 2023-05-23 (×2): qty 90, 90d supply, fill #0
  Filled 2023-09-21: qty 90, 90d supply, fill #1

## 2023-04-05 ENCOUNTER — Other Ambulatory Visit: Payer: Self-pay

## 2023-04-18 ENCOUNTER — Other Ambulatory Visit: Payer: Self-pay

## 2023-05-23 ENCOUNTER — Other Ambulatory Visit: Payer: Self-pay

## 2023-06-08 ENCOUNTER — Other Ambulatory Visit: Payer: Self-pay

## 2023-06-08 ENCOUNTER — Other Ambulatory Visit: Payer: Self-pay | Admitting: Family Medicine

## 2023-06-08 MED ORDER — METHYLPHENIDATE HCL ER (OSM) 18 MG PO TBCR
18.0000 mg | EXTENDED_RELEASE_TABLET | Freq: Every day | ORAL | 0 refills | Status: DC
  Filled 2023-06-08: qty 30, 30d supply, fill #0

## 2023-06-08 NOTE — Telephone Encounter (Signed)
Name of Medication: Concerta Name of Pharmacy: [facility] Last Fill or Written Date and Quantity: 02/24/23 #30 tabs/ 0 refill Last Office Visit and Type: 02/24/23 f/u Next Office Visit and Type: none scheduled

## 2023-06-30 ENCOUNTER — Encounter: Payer: Self-pay | Admitting: Family Medicine

## 2023-06-30 ENCOUNTER — Ambulatory Visit (INDEPENDENT_AMBULATORY_CARE_PROVIDER_SITE_OTHER): Payer: BC Managed Care – PPO | Admitting: Family Medicine

## 2023-06-30 VITALS — BP 96/62 | HR 57 | Temp 98.4°F | Ht 63.0 in | Wt 137.2 lb

## 2023-06-30 DIAGNOSIS — M542 Cervicalgia: Secondary | ICD-10-CM | POA: Diagnosis not present

## 2023-06-30 NOTE — Progress Notes (Signed)
Subjective:    Patient ID: Ariel Ortega, female    DOB: 09/22/1995, 28 y.o.   MRN: [id number]  HPI  Wt Readings from Last 3 Encounters:  06/30/23 137 lb 4 oz (62.3 kg)  02/24/23 135 lb (61.2 kg)  12/17/22 133 lb 2 oz (60.4 kg)   24.31 kg/m  Vitals:   06/30/23 0922  BP: 96/62  Pulse: (!) 57  Temp: 98.4 F (36.9 C)  SpO2: 99%   Pt presents with neck pain   1 week  Pain from base of neck to shoulders  Both sides - but worse on the left   Worse to try and hold head straight  No radiation to arms or hands Does radiate to back   Does cause a tension headache (bad yesterday)  No fever   No trauma    Took some old methocarbamol Also tylenol  Alt ice and heat  Massage helps a little in the shower     Patient Active Problem List   Diagnosis Date Noted   Eye irritation 03/08/2023   Depressed mood 12/17/2022   Tachycardia 03/23/2022   Lactose intolerance 03/10/2022   Exercise-induced bronchospasm 03/10/2022   Frequent headaches 01/14/2022   Encounter for routine gynecological examination 01/14/2022   Wheezing 12/07/2021   Irregular menses 08/26/2021   Fatigue 08/26/2021   Allergic rhinitis 07/22/2021   Neck pain 01/25/2020   Hand paresthesia 12/14/2019   ADHD (attention deficit hyperactivity disorder), inattentive type 10/22/2019   Dizziness 09/23/2014   History of drug overdose 05/08/2014   Panic disorder 04/22/2014   History of depression 04/19/2014   GAD (generalized anxiety disorder) 12/25/2013   Panic attacks 12/25/2013   Past Medical History:  Diagnosis Date   Angio-edema    Anxiety    Asthma    Depression 04/19/2014   Mononucleosis 04/09/2011   Past Surgical History:  Procedure Laterality Date   WISDOM TOOTH EXTRACTION     Social History   Tobacco Use   Smoking status: Never   Smokeless tobacco: Never  Vaping Use   Vaping status: Never Used  Substance Use Topics   Alcohol use: No    Alcohol/week: 0.0 standard drinks of alcohol    Drug use: No   Family History  Adopted: Yes  Problem Relation Age of Onset   Depression Mother    Anxiety disorder Mother    Cancer - Prostate Father    Hyperlipidemia Father    Hypertension Father    Cancer Father        prostate cancer    Breast cancer Neg Hx    Ovarian cancer Neg Hx    Colon cancer Neg Hx    Allergic rhinitis Neg Hx    Asthma Neg Hx    Eczema Neg Hx    Urticaria Neg Hx    Allergies  Allergen Reactions   Adderall [Amphetamine-Dextroamphetamine]     Shoulder and neck pain    Effexor [Venlafaxine] Nausea And Vomiting   Milk-Related Compounds Nausea And Vomiting    Lactose intolerance   Current Outpatient Medications on File Prior to Visit  Medication Sig Dispense Refill   EPINEPHrine 0.3 mg/0.3 mL IJ SOAJ injection Inject 0.3 mg into the muscle as needed.     FLUoxetine (PROZAC) 20 MG capsule Take 1 capsule (20 mg total) by mouth daily. 90 capsule 3   meclizine (ANTIVERT) 25 MG tablet Take 1 tablet (25 mg total) by mouth 3 (three) times daily as needed for dizziness. 20 tablet 1  methylphenidate (CONCERTA) 18 MG PO CR tablet Take 1 tablet (18 mg total) by mouth daily. 30 tablet 0   No current facility-administered medications on file prior to visit.    Review of Systems  Constitutional:  Negative for activity change, appetite change, fatigue, fever and unexpected weight change.  HENT:  Negative for congestion, ear pain, rhinorrhea, sinus pressure and sore throat.   Eyes:  Negative for pain, redness and visual disturbance.  Respiratory:  Negative for cough, shortness of breath and wheezing.   Cardiovascular:  Negative for chest pain and palpitations.  Gastrointestinal:  Negative for abdominal pain, blood in stool, constipation and diarrhea.  Endocrine: Negative for polydipsia and polyuria.  Genitourinary:  Negative for dysuria, frequency and urgency.  Musculoskeletal:  Positive for neck pain. Negative for arthralgias, back pain, myalgias and neck  stiffness.  Skin:  Negative for pallor and rash.  Allergic/Immunologic: Negative for environmental allergies.  Neurological:  Negative for dizziness, syncope and headaches.  Hematological:  Negative for adenopathy. Does not bruise/bleed easily.  Psychiatric/Behavioral:  Negative for decreased concentration and dysphoric mood. The patient is not nervous/anxious.        Objective:   Physical Exam Constitutional:      Appearance: Normal appearance.  HENT:     Head: Normocephalic and atraumatic.  Eyes:     Conjunctiva/sclera: Conjunctivae normal.     Pupils: Pupils are equal, round, and reactive to light.  Cardiovascular:     Rate and Rhythm: Regular rhythm. Bradycardia present.  Pulmonary:     Effort: Pulmonary effort is normal. No respiratory distress.  Musculoskeletal:     Cervical back: Spasms and tenderness present. No swelling, deformity, erythema, torticollis, bony tenderness or crepitus. Pain with movement present. Normal range of motion.     Comments: Some tenderness of left cervical musculature and trapezius  Less tender on right  Full rom  Worse discomfort  with neck extension and left rotation/ right tilt     Skin:    Findings: No erythema or rash.  Neurological:     Mental Status: She is alert.     Sensory: No sensory deficit.     Motor: No weakness.     Deep Tendon Reflexes: Reflexes normal.  Psychiatric:        Mood and Affect: Mood normal.           Assessment & Plan:   Problem List Items Addressed This Visit       Other   Neck pain - Primary   Worse on left, no neuro symptoms  Reassuring exam Suspect muscular strain   Discussed trial of cervical support pillow with foam Work on posture Heat/ice prn (heat may feel best)  Trial of ibuprofen 400 mg with food up to every 8 hours (caution with fluoxetine-aware)   Update if not starting to improve in a week or if worsening  Call back and Er precautions noted in detail today    Consider PT and  /or imaging if not improving  Handout given re: cervical strain

## 2023-06-30 NOTE — Assessment & Plan Note (Addendum)
Worse on left, no neuro symptoms  Reassuring exam Suspect muscular strain   Discussed trial of cervical support pillow with foam Work on posture Heat/ice prn (heat may feel best)  Trial of ibuprofen 400 mg with food up to every 8 hours (caution with fluoxetine-aware)   Update if not starting to improve in a week or if worsening  Call back and Er precautions noted in detail today    Consider PT and /or imaging if not improving  Handout given re: cervical strain

## 2023-06-30 NOTE — Patient Instructions (Addendum)
Investigate pillow options for cervical support Memory foam is great   Continue ice and heat   Some ibuprofen over the counter with food 400 mg up to every 8 hours as needed  is ok  If it bothers stomach/ stop it   Watch for pain or numbness in arms or hands    Update if not starting to improve in a week or if worsening

## 2023-10-10 ENCOUNTER — Encounter: Payer: Self-pay | Admitting: Family Medicine

## 2023-10-11 ENCOUNTER — Telehealth: Payer: Self-pay | Admitting: *Deleted

## 2023-10-11 NOTE — Telephone Encounter (Signed)
 Pt sent a message saying:  Hi,  I've been taking the Prozac  since November & it works great but I have noticed that when I take my concerta  that it seems to make me agitated.  I've also noticed the Prozac  has increased my appetite a lot & as a result of that I have gained weight. I was a bit curious about Wellbutrin as an option for medication. If possible I'd like to try it mainly just to see if it'll deal with the adhd and depression symptoms.  I know we talked about this medication back in November but ultimately decided not to try it.  Let me know your thoughts.  Thank you.

## 2023-10-11 NOTE — Telephone Encounter (Signed)
 Called  pt and schedule a appt for cpe

## 2023-10-11 NOTE — Telephone Encounter (Signed)
 I am out of town this week  Please schedule follow up appointment when I return

## 2023-10-21 ENCOUNTER — Encounter: Payer: Self-pay | Admitting: Family Medicine

## 2023-10-21 ENCOUNTER — Other Ambulatory Visit: Payer: Self-pay

## 2023-10-21 ENCOUNTER — Ambulatory Visit: Admitting: Family Medicine

## 2023-10-21 VITALS — BP 98/70 | HR 68 | Temp 99.0°F | Ht 63.0 in | Wt 139.4 lb

## 2023-10-21 DIAGNOSIS — R4589 Other symptoms and signs involving emotional state: Secondary | ICD-10-CM

## 2023-10-21 DIAGNOSIS — F900 Attention-deficit hyperactivity disorder, predominantly inattentive type: Secondary | ICD-10-CM

## 2023-10-21 DIAGNOSIS — F411 Generalized anxiety disorder: Secondary | ICD-10-CM

## 2023-10-21 MED ORDER — BUPROPION HCL ER (XL) 150 MG PO TB24
150.0000 mg | ORAL_TABLET | Freq: Every day | ORAL | 0 refills | Status: DC
  Filled 2023-10-21: qty 90, 90d supply, fill #0

## 2023-10-21 NOTE — Assessment & Plan Note (Addendum)
 Concerta  18 mg is helpful but not enough to warrant side effects  Pt feels very irritable and tired when it wears off  Overall pt thinks her control is worse   Wants to try wellbutrin instead  (also for mood)  Will send 150 mg xl to pharmacy  Follow up 6-8 week  Encouraged self care  Exercise, outdoor time and rest  Encouraged to continue good organizational habits

## 2023-10-21 NOTE — Progress Notes (Signed)
 Subjective:    Patient ID: Ariel Ortega, female    DOB: 01/10/1996, 28 y.o.   MRN: [id number]  HPI  Wt Readings from Last 3 Encounters:  10/21/23 139 lb 6 oz (63.2 kg)  06/30/23 137 lb 4 oz (62.3 kg)  02/24/23 135 lb (61.2 kg)   24.69 kg/m  Vitals:   10/21/23 1044  BP: 98/70  Pulse: 68  Temp: 99 F (37.2 C)  SpO2: 99%   Pt presents for follow up of mood (anxiety and depression in setting of ADHD)   Received this email:  Pt sent a message saying:   Hi,  I've been taking the Prozac  since November & it works great but I have noticed that when I take my concerta  that it seems to make me agitated.  I've also noticed the Prozac  has increased my appetite a lot & as a result of that I have gained weight. I was a bit curious about Wellbutrin as an option for medication. If possible I'd like to try it mainly just to see if it'll deal with the adhd and depression symptoms.  I know we talked about this medication back in November but ultimately decided not to try it.      Currently taking Concerta  18 mg daily - not working well enough to be worth the side effects   Fluoxetine  20 mg daily    Finds that she is very tired in the morning before she takes it  When it wears off   Takes fluoxetine  at night  Thinks it increasing appetite - all day long  Craves sugar a lot         10/21/2023   10:51 AM 06/30/2023    9:30 AM 02/24/2023    9:33 AM 12/17/2022    2:36 PM 08/24/2022    8:59 AM  Depression screen PHQ 2/9  Decreased Interest 0 0 0 1 0  Down, Depressed, Hopeless 1 0 0 3 0  PHQ - 2 Score 1 0 0 4 0  Altered sleeping 1 0 0 0 0  Tired, decreased energy 1 0 0 3 0  Change in appetite 1 0 0 2 0  Feeling bad or failure about yourself  0 0 0 2 0  Trouble concentrating  0 0 1 0  Moving slowly or fidgety/restless 0 0 0 0 0  Suicidal thoughts 0 0 0 0 0  PHQ-9 Score 4 0 0 12 0  Difficult doing work/chores  Not difficult at all Not difficult at all  Not difficult at all       10/21/2023   10:51 AM 06/30/2023    9:30 AM 02/24/2023    9:33 AM 12/17/2022    2:36 PM  GAD 7 : Generalized Anxiety Score  Nervous, Anxious, on Edge 0 0 0 2  Control/stop worrying 0 0 0 0  Worry too much - different things 0 0 0 0  Trouble relaxing 0 0 0 0  Restless 0 0 0 3  Easily annoyed or irritable 0 0 0 0  Afraid - awful might happen 0 0 0   Total GAD 7 Score 0 0 0   Anxiety Difficulty Not difficult at all Not difficult at all Not difficult at all        Patient Active Problem List   Diagnosis Date Noted   Eye irritation 03/08/2023   Depressed mood 12/17/2022   Tachycardia 03/23/2022   Lactose intolerance 03/10/2022   Exercise-induced bronchospasm 03/10/2022   Frequent headaches  01/14/2022   Encounter for routine gynecological examination 01/14/2022   Wheezing 12/07/2021   Irregular menses 08/26/2021   Fatigue 08/26/2021   Allergic rhinitis 07/22/2021   Neck pain 01/25/2020   Hand paresthesia 12/14/2019   ADHD (attention deficit hyperactivity disorder), inattentive type 10/22/2019   Dizziness 09/23/2014   History of drug overdose 05/08/2014   Panic disorder 04/22/2014   History of depression 04/19/2014   GAD (generalized anxiety disorder) 12/25/2013   Past Medical History:  Diagnosis Date   Angio-edema    Anxiety    Asthma    Depression 04/19/2014   Mononucleosis 04/09/2011   Past Surgical History:  Procedure Laterality Date   WISDOM TOOTH EXTRACTION     Social History   Tobacco Use   Smoking status: Never   Smokeless tobacco: Never  Vaping Use   Vaping status: Never Used  Substance Use Topics   Alcohol use: No    Alcohol/week: 0.0 standard drinks of alcohol   Drug use: No   Family History  Adopted: Yes  Problem Relation Age of Onset   Depression Mother    Anxiety disorder Mother    Cancer - Prostate Father    Hyperlipidemia Father    Hypertension Father    Cancer Father        prostate cancer    Breast cancer Neg Hx    Ovarian cancer Neg  Hx    Colon cancer Neg Hx    Allergic rhinitis Neg Hx    Asthma Neg Hx    Eczema Neg Hx    Urticaria Neg Hx    Allergies  Allergen Reactions   Adderall [Amphetamine -Dextroamphetamine ]     Shoulder and neck pain    Effexor [Venlafaxine] Nausea And Vomiting   Milk-Related Compounds Nausea And Vomiting    Lactose intolerance   Current Outpatient Medications on File Prior to Visit  Medication Sig Dispense Refill   EPINEPHrine  0.3 mg/0.3 mL IJ SOAJ injection Inject 0.3 mg into the muscle as needed.     FLUoxetine  (PROZAC ) 20 MG capsule Take 1 capsule (20 mg total) by mouth daily. 90 capsule 3   meclizine  (ANTIVERT ) 25 MG tablet Take 1 tablet (25 mg total) by mouth 3 (three) times daily as needed for dizziness. 20 tablet 1   No current facility-administered medications on file prior to visit.    Review of Systems  Constitutional:  Negative for activity change, appetite change, fatigue, fever and unexpected weight change.  HENT:  Negative for congestion, ear pain, rhinorrhea, sinus pressure and sore throat.   Eyes:  Negative for pain, redness and visual disturbance.  Respiratory:  Negative for cough, shortness of breath and wheezing.   Cardiovascular:  Negative for chest pain and palpitations.  Gastrointestinal:  Negative for abdominal pain, blood in stool, constipation and diarrhea.  Endocrine: Negative for polydipsia and polyuria.  Genitourinary:  Negative for dysuria, frequency and urgency.  Musculoskeletal:  Negative for arthralgias, back pain and myalgias.  Skin:  Negative for pallor and rash.  Allergic/Immunologic: Negative for environmental allergies.  Neurological:  Negative for dizziness, syncope and headaches.  Hematological:  Negative for adenopathy. Does not bruise/bleed easily.  Psychiatric/Behavioral:  Positive for decreased concentration and dysphoric mood. Negative for behavioral problems, confusion and suicidal ideas. The patient is not nervous/anxious.         Objective:   Physical Exam Constitutional:      General: She is not in acute distress.    Appearance: Normal appearance. She is well-developed and normal weight.  She is not ill-appearing or diaphoretic.  HENT:     Head: Normocephalic and atraumatic.  Eyes:     Conjunctiva/sclera: Conjunctivae normal.     Pupils: Pupils are equal, round, and reactive to light.  Neck:     Thyroid : No thyromegaly.     Vascular: No carotid bruit or JVD.  Cardiovascular:     Rate and Rhythm: Normal rate and regular rhythm.     Heart sounds: Normal heart sounds.     No gallop.  Pulmonary:     Effort: Pulmonary effort is normal. No respiratory distress.     Breath sounds: Normal breath sounds. No wheezing or rales.  Abdominal:     General: There is no distension or abdominal bruit.     Palpations: Abdomen is soft.  Musculoskeletal:     Cervical back: Normal range of motion and neck supple.     Right lower leg: No edema.     Left lower leg: No edema.  Lymphadenopathy:     Cervical: No cervical adenopathy.  Skin:    General: Skin is warm and dry.     Coloration: Skin is not pale.     Findings: No rash.  Neurological:     Mental Status: She is alert.     Cranial Nerves: No cranial nerve deficit.     Coordination: Coordination normal.     Deep Tendon Reflexes: Reflexes are normal and symmetric. Reflexes normal.  Psychiatric:        Attention and Perception: Attention normal.        Mood and Affect: Mood normal. Affect is not flat or tearful.        Speech: Speech normal.        Behavior: Behavior normal.        Cognition and Memory: Cognition and memory normal.     Comments: Candidly discusses symptoms and stressors              Assessment & Plan:   Problem List Items Addressed This Visit       Other   GAD (generalized anxiety disorder)   Anxiety is well controlled with fluoxetine  20 mg daily  GAD7 score is 0  ? If increase appetite ?   Plan to add wellbutrin xl but update if  side effects (see a/p for depression)       Relevant Medications   buPROPion (WELLBUTRIN XL) 150 MG 24 hr tablet   Depressed mood - Primary   While fluoxetine  20 mg helps she thinks it increase appetite (even when on concerta )  Also adhd is not well controlled   PHQ 4 GAD7 0  Will try adding wellbutrin xl 150 mg daily  For now continue fluoxetine  but may consider wean later   Discussed expectations of wellbutrin  medication including time to effectiveness and mechanism of action, also poss of side effects (early and late)- including mental fuzziness, weight or appetite change, nausea and poss of worse dep or anxiety (even suicidal thoughts)  Pt voiced understanding and will stop med and update if this occurs    Encouraged self care  Exercise included   Follow up in 4-6 weeks or earlier if needed  Call back and Er precautions noted in detail today        ADHD (attention deficit hyperactivity disorder), inattentive type   Concerta  18 mg is helpful but not enough to warrant side effects  Pt feels very irritable and tired when it wears off  Overall pt thinks her  control is worse   Wants to try wellbutrin instead  (also for mood)  Will send 150 mg xl to pharmacy  Follow up 6-8 week  Encouraged self care  Exercise, outdoor time and rest  Encouraged to continue good organizational habits

## 2023-10-21 NOTE — Assessment & Plan Note (Signed)
 Anxiety is well controlled with fluoxetine  20 mg daily  GAD7 score is 0  ? If increase appetite ?   Plan to add wellbutrin xl but update if side effects (see a/p for depression)

## 2023-10-21 NOTE — Assessment & Plan Note (Signed)
 While fluoxetine  20 mg helps she thinks it increase appetite (even when on concerta )  Also adhd is not well controlled   PHQ 4 GAD7 0  Will try adding wellbutrin xl 150 mg daily  For now continue fluoxetine  but may consider wean later   Discussed expectations of wellbutrin  medication including time to effectiveness and mechanism of action, also poss of side effects (early and late)- including mental fuzziness, weight or appetite change, nausea and poss of worse dep or anxiety (even suicidal thoughts)  Pt voiced understanding and will stop med and update if this occurs    Encouraged self care  Exercise included   Follow up in 4-6 weeks or earlier if needed  Call back and Er precautions noted in detail today

## 2023-10-21 NOTE — Patient Instructions (Addendum)
 Stop the concerta    Start wellbutrin xl 150 mg each day in the am (for mood and adhd)  For now continue the fluoxetine  at 20 mg in pm   Let's see how you do with this    Follow up in 6-8 weeks   If intolerable side effects or if you feel worse then hold the wellbutrin and call us    Take care of yourself  Exercise  Get outdoors

## 2023-11-15 ENCOUNTER — Encounter: Payer: Self-pay | Admitting: Family Medicine

## 2023-11-15 MED ORDER — FLUOXETINE HCL 10 MG PO CAPS
30.0000 mg | ORAL_CAPSULE | Freq: Every day | ORAL | 1 refills | Status: AC
  Filled 2023-11-15 – 2024-03-17 (×3): qty 90, 30d supply, fill #0
  Filled 2024-05-04: qty 90, 30d supply, fill #1

## 2023-11-16 ENCOUNTER — Other Ambulatory Visit: Payer: Self-pay

## 2023-11-26 ENCOUNTER — Encounter: Payer: Self-pay | Admitting: Family Medicine

## 2023-11-28 ENCOUNTER — Other Ambulatory Visit: Payer: Self-pay

## 2023-11-28 MED ORDER — METHYLPHENIDATE HCL ER 18 MG PO TB24
18.0000 mg | ORAL_TABLET | Freq: Every day | ORAL | 0 refills | Status: DC
  Filled 2023-11-28: qty 30, 30d supply, fill #0

## 2024-03-04 ENCOUNTER — Other Ambulatory Visit: Payer: Self-pay

## 2024-03-04 ENCOUNTER — Other Ambulatory Visit: Payer: Self-pay | Admitting: Family Medicine

## 2024-03-05 ENCOUNTER — Other Ambulatory Visit: Payer: Self-pay

## 2024-03-05 ENCOUNTER — Other Ambulatory Visit: Payer: Self-pay | Admitting: Family Medicine

## 2024-03-05 MED ORDER — METHYLPHENIDATE HCL ER 18 MG PO TB24
18.0000 mg | ORAL_TABLET | Freq: Every day | ORAL | 0 refills | Status: DC
  Filled 2024-03-05 – 2024-03-29 (×3): qty 30, 30d supply, fill #0

## 2024-03-05 NOTE — Telephone Encounter (Signed)
 Name of Medication: Methylphenidate  Name of Pharmacy: [facility] pharmacy  Last Fill or Written Date and Quantity: 11/28/23 #30 tab/ 0 refill Last Office Visit and Type: f/u on 10/21/23  Next Office Visit and Type: none scheduled

## 2024-03-06 ENCOUNTER — Other Ambulatory Visit: Payer: Self-pay

## 2024-03-15 ENCOUNTER — Other Ambulatory Visit: Payer: Self-pay

## 2024-03-17 ENCOUNTER — Other Ambulatory Visit: Payer: Self-pay

## 2024-03-21 ENCOUNTER — Encounter: Payer: Self-pay | Admitting: Family Medicine

## 2024-03-21 ENCOUNTER — Other Ambulatory Visit (HOSPITAL_COMMUNITY): Payer: Self-pay

## 2024-03-21 ENCOUNTER — Telehealth: Payer: Self-pay

## 2024-03-21 NOTE — Telephone Encounter (Signed)
 Pharmacy Patient Advocate Encounter   Received notification from Patient Advice Request messages that prior authorization for Concerta  18 is required/requested.   Insurance verification completed.   The patient is insured through [employer]. Someone else has started the PA request Per test claim:

## 2024-03-22 NOTE — Telephone Encounter (Signed)
 Not sure how to proceed, I have not started an PA and don't have access to check. Will route to [place] to see if she can give guidance

## 2024-03-27 ENCOUNTER — Encounter: Payer: Self-pay | Admitting: Pharmacist

## 2024-03-27 NOTE — Telephone Encounter (Signed)
 Thank you :)

## 2024-03-27 NOTE — Progress Notes (Signed)
 Chart Review Reason: Drug information Question - Medication Access  Summary: Prior authorization required   Per last PCP visit 6/6:  Concerta  18 mg is helpful but not enough to warrant side effects  Pt feels very irritable and tired when it wears off   Wants to try wellbutrin  instead  (also for mood)   No current Rx for wellbutrin , Msgd pcp to clarify though will proceed with prior auth for methylphenidate  ER at this time.    CMM Prior authorization initiated: Key: [id number] Status: This request has received a Favorable outcome.  Starts on: 03/27/2024 12:00:00 AM, Coverage Ends on: 03/27/2025 12:00:00 AM.  Ariel Ortega, PharmD Clinical Pharmacist [facility] [phone]

## 2024-03-27 NOTE — Telephone Encounter (Signed)
 She is back on it  The wellbutrin  did not work for her  Thanks for the help

## 2024-03-29 ENCOUNTER — Other Ambulatory Visit: Payer: Self-pay

## 2024-05-04 ENCOUNTER — Other Ambulatory Visit: Payer: Self-pay

## 2024-05-04 ENCOUNTER — Other Ambulatory Visit: Payer: Self-pay | Admitting: Family Medicine

## 2024-05-04 MED FILL — Methylphenidate HCl Tab ER 24HR 18 MG: ORAL | 30 days supply | Qty: 30 | Fill #0 | Status: AC

## 2024-05-04 NOTE — Telephone Encounter (Signed)
 Name of Medication: Methylphenidate  Name of Pharmacy: [facility] pharmacy  Last Fill or Written Date and Quantity: 03/05/24 #30 tab/ 0 refill Last Office Visit and Type: f/u on 10/21/23  Next Office Visit and Type: none schedule

## 2024-05-04 NOTE — Telephone Encounter (Signed)
 Name of Medication: Methylphenidate  Name of Pharmacy: [facility] pharmacy  Last Fill or Written Date and Quantity: 03/05/24 #30 tab/ 0 refill Last Office Visit and Type: f/u on 10/21/23  Next Office Visit and Type: none scheduled
